# Patient Record
Sex: Male | Born: 1950 | Race: White | Hispanic: No | Marital: Married | Smoking: Never smoker
Health system: Southern US, Community
[De-identification: ages and names within clinical notes are randomized; demographics above are authoritative.]

## PROBLEM LIST (undated history)

## (undated) DIAGNOSIS — I8390 Asymptomatic varicose veins of unspecified lower extremity: Secondary | ICD-10-CM

## (undated) DIAGNOSIS — I10 Essential (primary) hypertension: Secondary | ICD-10-CM

## (undated) HISTORY — PX: TONSILLECTOMY: SUR1361

## (undated) HISTORY — PX: NASAL SEPTUM SURGERY: SHX37

## (undated) HISTORY — PX: KNEE ARTHROSCOPY: SUR90

## (undated) HISTORY — DX: Asymptomatic varicose veins of unspecified lower extremity: I83.90

---

## 2004-06-16 ENCOUNTER — Encounter: Admission: RE | Admit: 2004-06-16 | Discharge: 2004-06-16 | Payer: Self-pay | Admitting: Neurology

## 2007-09-06 ENCOUNTER — Encounter: Payer: Self-pay | Admitting: Internal Medicine

## 2007-10-04 ENCOUNTER — Ambulatory Visit: Payer: Self-pay | Admitting: Internal Medicine

## 2007-10-04 DIAGNOSIS — I10 Essential (primary) hypertension: Secondary | ICD-10-CM | POA: Insufficient documentation

## 2007-10-04 DIAGNOSIS — I8390 Asymptomatic varicose veins of unspecified lower extremity: Secondary | ICD-10-CM | POA: Insufficient documentation

## 2007-10-04 HISTORY — DX: Asymptomatic varicose veins of unspecified lower extremity: I83.90

## 2008-02-23 ENCOUNTER — Ambulatory Visit: Payer: Self-pay | Admitting: Internal Medicine

## 2008-04-02 ENCOUNTER — Ambulatory Visit: Payer: Self-pay | Admitting: Internal Medicine

## 2008-04-02 LAB — CONVERTED CEMR LAB
ALT: 24 units/L (ref 0–53)
AST: 28 units/L (ref 0–37)
Calcium: 9.1 mg/dL (ref 8.4–10.5)
Chloride: 105 meq/L (ref 96–112)
Creatinine, Ser: 1.1 mg/dL (ref 0.4–1.5)
Eosinophils Absolute: 0.5 10*3/uL (ref 0.0–0.7)
GFR calc Af Amer: 88 mL/min
Glucose, Bld: 85 mg/dL (ref 70–99)
HCT: 43.2 % (ref 39.0–52.0)
HDL: 42.2 mg/dL (ref >39.0)
Hemoglobin: 15.1 g/dL (ref 13.0–17.0)
LDL Cholesterol: 84 mg/dL (ref 0–99)
Lymphocytes Relative: 25.4 % (ref 12.0–46.0)
MCHC: 34.8 g/dL (ref 30.0–36.0)
Neutrophils Relative %: 58.3 % (ref 43.0–77.0)
Potassium: 4 meq/L (ref 3.5–5.1)
Protein, U semiquant: NEGATIVE
RBC: 5 M/uL (ref 4.22–5.81)
WBC Urine, dipstick: NEGATIVE
WBC: 7.7 10*3/uL (ref 4.5–10.5)

## 2008-04-09 ENCOUNTER — Ambulatory Visit: Payer: Self-pay | Admitting: Internal Medicine

## 2008-04-11 ENCOUNTER — Ambulatory Visit: Payer: Self-pay | Admitting: Cardiology

## 2008-04-23 ENCOUNTER — Encounter: Payer: Self-pay | Admitting: Internal Medicine

## 2009-04-04 ENCOUNTER — Ambulatory Visit: Payer: Self-pay | Admitting: Internal Medicine

## 2009-04-04 LAB — CONVERTED CEMR LAB
Basophils Absolute: 0.1 10*3/uL (ref 0.0–0.1)
Bilirubin Urine: NEGATIVE
Bilirubin, Direct: 0.2 mg/dL (ref 0.0–0.3)
Calcium: 9.5 mg/dL (ref 8.4–10.5)
Chloride: 102 meq/L (ref 96–112)
Cholesterol: 173 mg/dL (ref 0–200)
Creatinine, Ser: 1.3 mg/dL (ref 0.4–1.5)
Eosinophils Absolute: 0.6 10*3/uL (ref 0.0–0.7)
Eosinophils Relative: 7.3 % — ABNORMAL HIGH (ref 0.0–5.0)
LDL Cholesterol: 111 mg/dL — ABNORMAL HIGH (ref 0–99)
Lymphocytes Relative: 27.6 % (ref 12.0–46.0)
Lymphs Abs: 2.3 10*3/uL (ref 0.7–4.0)
Monocytes Absolute: 0.9 10*3/uL (ref 0.1–1.0)
Monocytes Relative: 10.5 % (ref 3.0–12.0)
Neutro Abs: 4.3 10*3/uL (ref 1.4–7.7)
Neutrophils Relative %: 53.7 % (ref 43.0–77.0)
Potassium: 3.9 meq/L (ref 3.5–5.1)
Sodium: 139 meq/L (ref 135–145)
Specific Gravity, Urine: 1.02 (ref 1.000–1.030)
Total CHOL/HDL Ratio: 4
Triglycerides: 93 mg/dL (ref 0.0–149.0)
Urine Glucose: NEGATIVE mg/dL
pH: 5.5 (ref 5.0–8.0)

## 2009-04-11 ENCOUNTER — Ambulatory Visit: Payer: Self-pay | Admitting: Internal Medicine

## 2009-04-23 ENCOUNTER — Ambulatory Visit: Payer: Self-pay | Admitting: Internal Medicine

## 2009-04-23 DIAGNOSIS — J069 Acute upper respiratory infection, unspecified: Secondary | ICD-10-CM | POA: Insufficient documentation

## 2009-05-31 IMAGING — CT CT PARANASAL SINUSES LIMITED
1 series · 16 of 30 positions shown, 20 images · non-contrast
Comparison: 06/16/2004 MR examination.

CLINICAL DATA: History given of chronic sinusitis.

CT PARANASAL SINUS LIMITED WITHOUT CONTRAST

[Series 2: ltd sinus 3.0 h30s · axial · 0.31mm/px · z∈[-144,-12]mm · 16 of 33 slices shown, 20 images]
[im 2/33  brain]
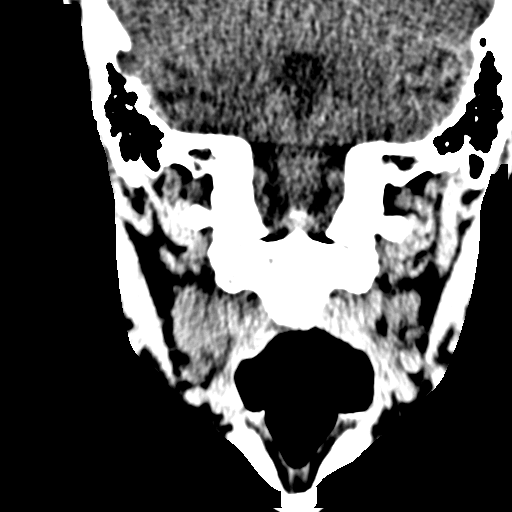
[im 2/33  bone]
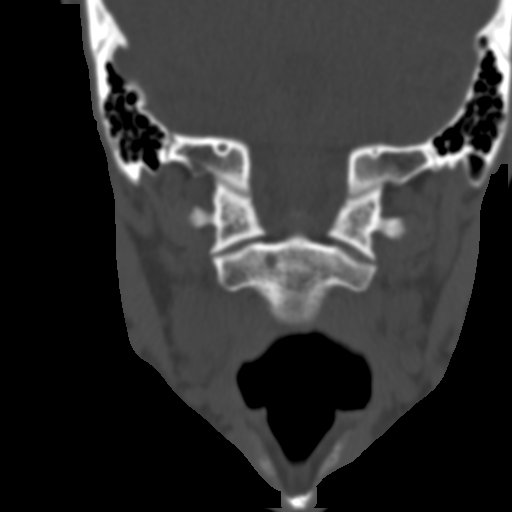
[im 4/33  bone]
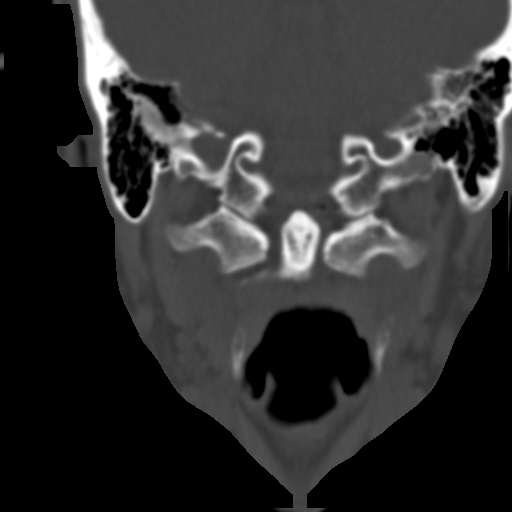
[im 6/33  bone]
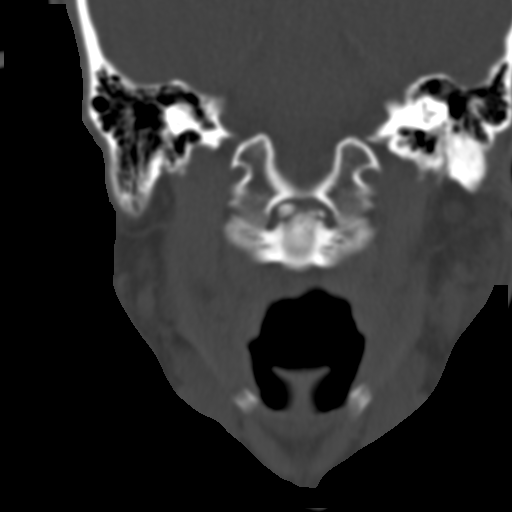
[im 8/33  bone]
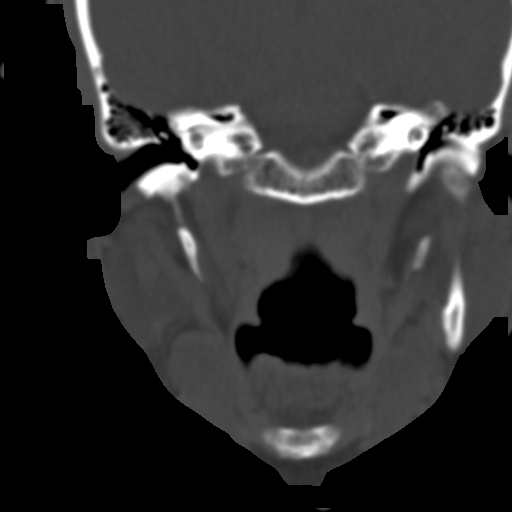
[im 9/33  brain]
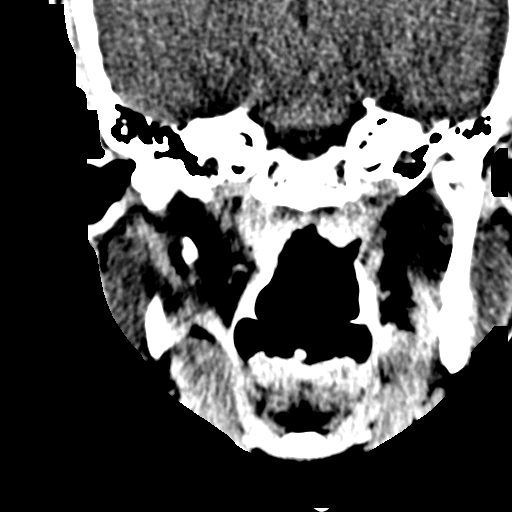
[im 9/33  bone]
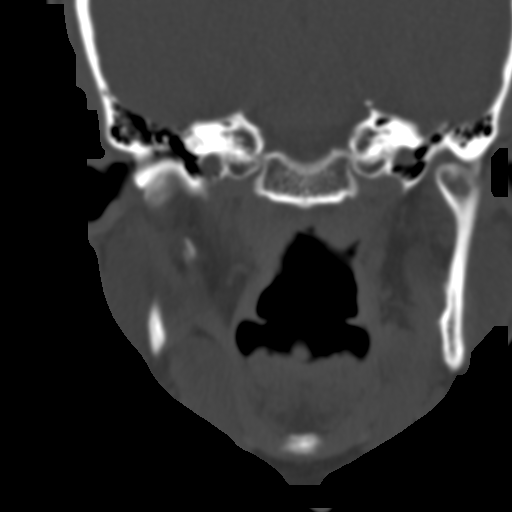
[im 12/33  bone]
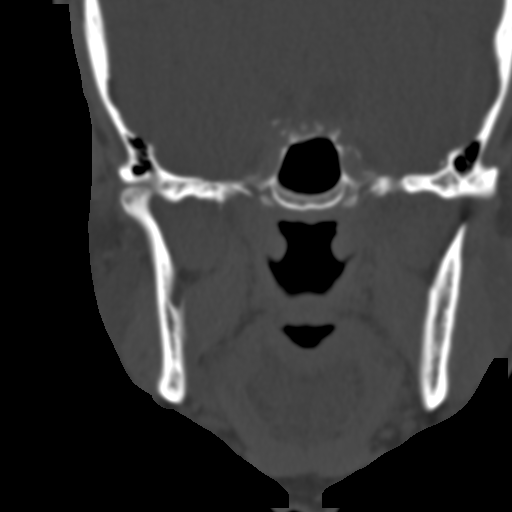
[im 14/33  bone]
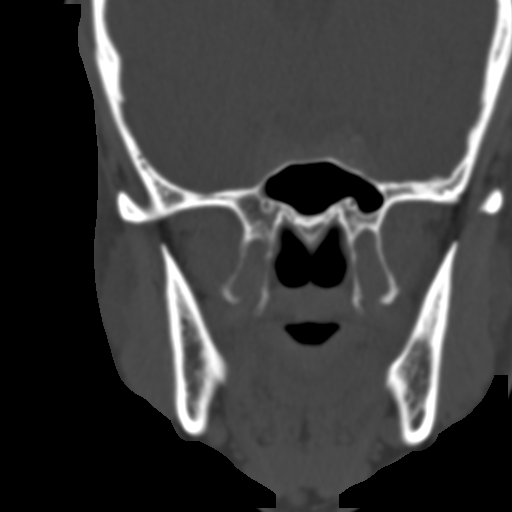
[im 16/33  bone]
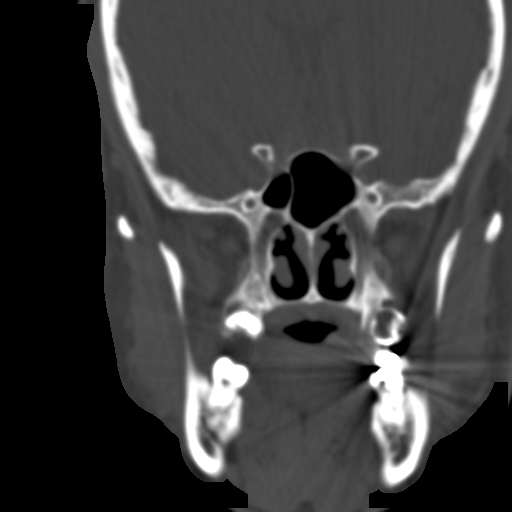
[im 17/33  brain]
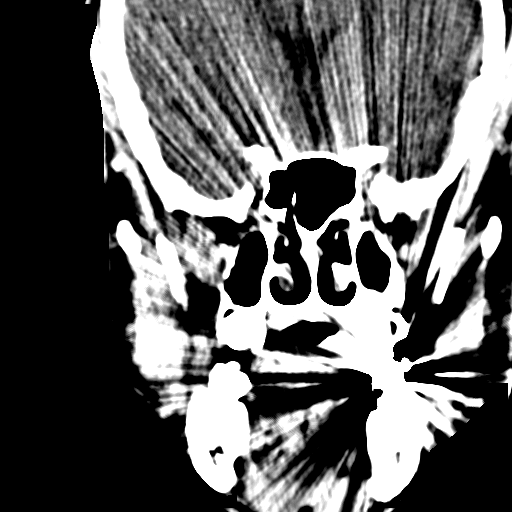
[im 17/33  bone]
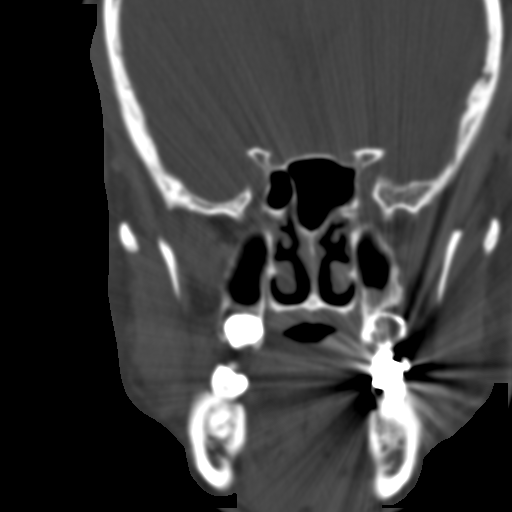
[im 19/33  bone]
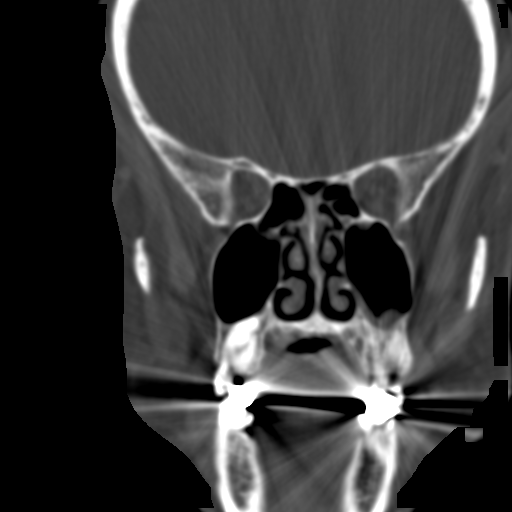
[im 21/33  bone]
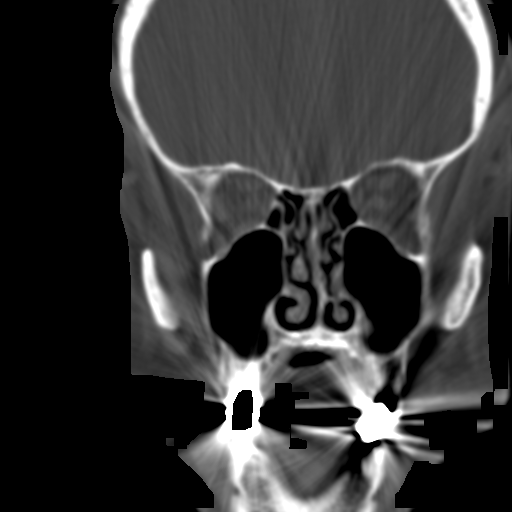
[im 24/33  bone]
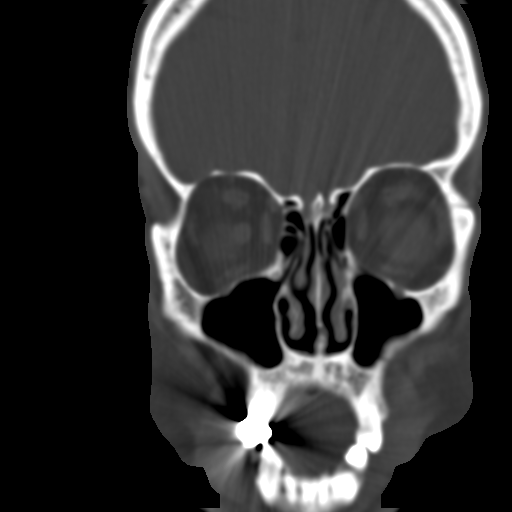
[im 25/33  brain]
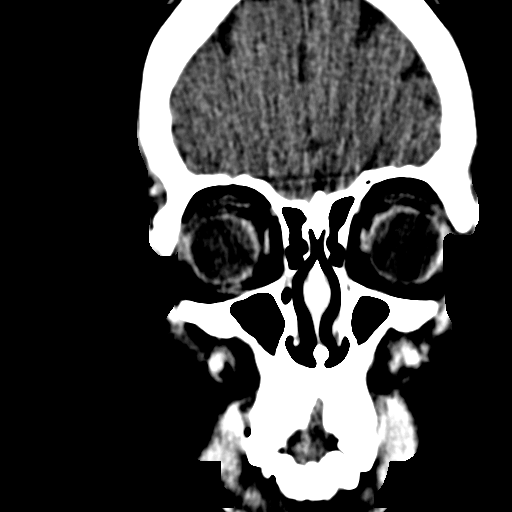
[im 25/33  bone]
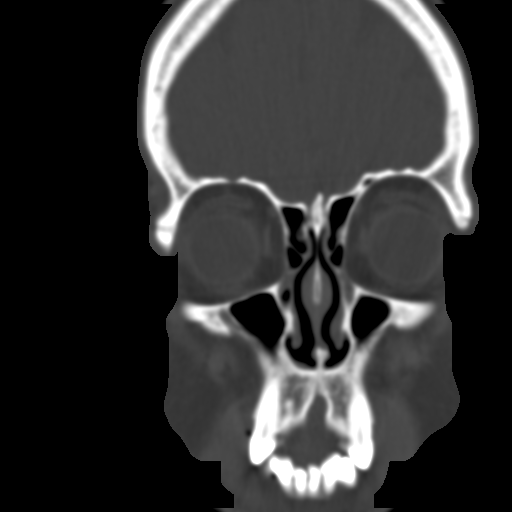
[im 27/33  bone]
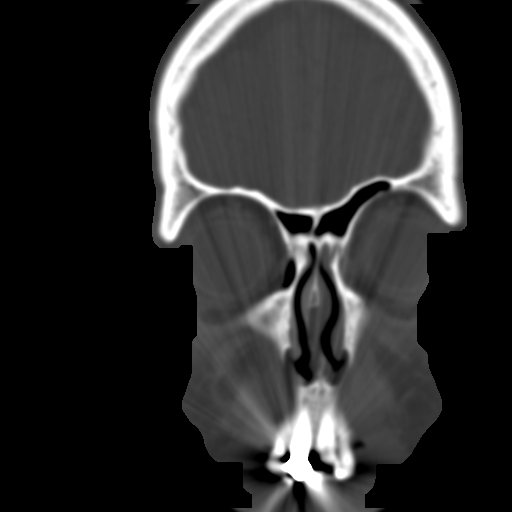
[im 29/33  bone]
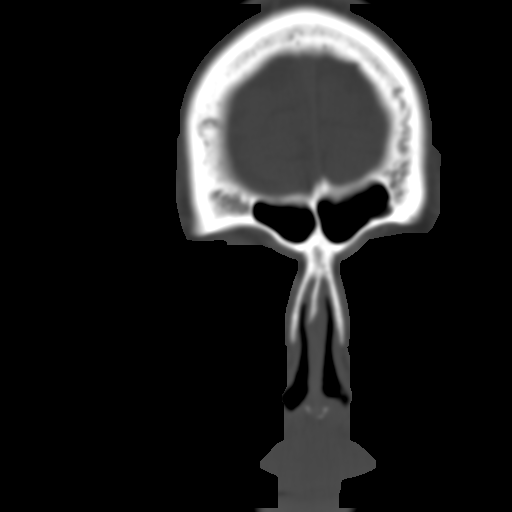
[im 31/33  bone]
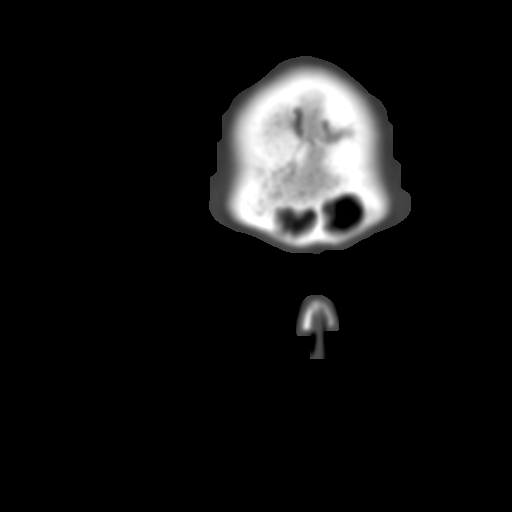

[16 of 30 positions shown; findings below may reference images not displayed]

FINDINGS: The nasal septum is intact with no evidence of
perforation or destruction.  There is slight inferior deviation to
the left with minimal spurring.  No concha bullosa is evident.  The
right maxillary ostium is not demonstrated patent.  The left
maxillary ostium is not demonstrated on this limited coronal
examination.

No mucosal thickening is evident in the right maxillary sinus.
There is mucosal thickening and mucus retention cyst or polyp
formation in the inferior aspect of the left maxillary antrum.  No
air fluid level is seen.  The ethmoid air cells are well-aerated.
No frontal sinus disease is evident.  No sphenoid sinus disease is
seen.
IMPRESSION: There is mucosal thickening and mucus retention cyst or polyp
formation in the inferior aspect of the left maxillary antrum.  No
air fluid level is seen.

## 2009-08-06 ENCOUNTER — Encounter: Payer: Self-pay | Admitting: Internal Medicine

## 2009-10-08 ENCOUNTER — Encounter: Payer: Self-pay | Admitting: Internal Medicine

## 2009-11-06 ENCOUNTER — Encounter: Payer: Self-pay | Admitting: Internal Medicine

## 2010-05-28 NOTE — Letter (Signed)
Summary:  [facility]  [facility]   Imported By: Maryln Gottron 10/23/2009 11:03:26  _____________________________________________________________________  External Attachment:    Type:   Image     Comment:   External Document

## 2010-05-28 NOTE — Letter (Signed)
Summary: [facility]   [facility]   Imported By: Maryln Gottron 11/14/2009 10:20:22  _____________________________________________________________________  External Attachment:    Type:   Image     Comment:   External Document

## 2010-05-28 NOTE — Letter (Signed)
Summary: [facility]  [facility]   Imported By: Maryln Gottron 08/14/2009 12:36:08  _____________________________________________________________________  External Attachment:    Type:   Image     Comment:   External Document

## 2010-12-18 ENCOUNTER — Encounter: Payer: Self-pay | Admitting: *Deleted

## 2010-12-18 ENCOUNTER — Emergency Department (HOSPITAL_BASED_OUTPATIENT_CLINIC_OR_DEPARTMENT_OTHER)
Admission: EM | Admit: 2010-12-18 | Discharge: 2010-12-18 | Disposition: A | Discharge status: Home / Self Care | Payer: 59 | Attending: Emergency Medicine | Admitting: Emergency Medicine

## 2010-12-18 DIAGNOSIS — T6391XA Toxic effect of contact with unspecified venomous animal, accidental (unintentional), initial encounter: Secondary | ICD-10-CM | POA: Insufficient documentation

## 2010-12-18 DIAGNOSIS — I10 Essential (primary) hypertension: Secondary | ICD-10-CM | POA: Insufficient documentation

## 2010-12-18 DIAGNOSIS — T63391A Toxic effect of venom of other spider, accidental (unintentional), initial encounter: Secondary | ICD-10-CM | POA: Insufficient documentation

## 2010-12-18 DIAGNOSIS — L02419 Cutaneous abscess of limb, unspecified: Secondary | ICD-10-CM | POA: Insufficient documentation

## 2010-12-18 HISTORY — DX: Essential (primary) hypertension: I10

## 2010-12-18 MED ORDER — CEFTRIAXONE SODIUM 1 G IJ SOLR
INTRAMUSCULAR | Status: AC
  Filled 2010-12-18: qty 1

## 2010-12-18 MED ORDER — DOXYCYCLINE HYCLATE 100 MG PO CAPS: 100.0000 mg | ORAL_CAPSULE | Freq: Two times a day (BID) | ORAL | Status: AC

## 2010-12-18 MED ORDER — CEFTRIAXONE SODIUM 1 G IJ SOLR
1.0000 g | Freq: Once | INTRAMUSCULAR | Status: AC
  Administered 2010-12-18: 1 g via INTRAMUSCULAR
  Filled 2010-12-18: qty 1

## 2010-12-18 NOTE — ED Provider Notes (Signed)
Evaluation and management procedures were performed by the mid-level provider (PA/NP/CNM) under my supervision/collaboration. I was present and available during the ED course. Gaylin Bulthuis Y.   Gavin Pound. Kenan Moodie, MD 12/18/10 1246

## 2010-12-18 NOTE — ED Provider Notes (Signed)
History     CSN: [id number] Arrival date & time: 12/18/2010 11:35 AM  Chief Complaint  Patient presents with  . Insect Bite   HPI Comments: Pt states that he was sent over here from urgent care because he may need a surgical intervention related to a compartment syndrome related to the spider bite  Patient is a 60 y.o. male presenting with abscess. The history is provided by the patient. No language interpreter was used.  Abscess  This is a new problem. The current episode started less than one week ago. The problem has been gradually worsening. The abscess is present on the right lower leg. The problem is mild. The abscess is characterized by redness. Associated with: pt states that he took some outdated doxy that he had at home. Pertinent negatives include no fever, no diarrhea and no vomiting.    Past Medical History  Diagnosis Date  . Hypertension     Past Surgical History  Procedure Date  . Knee arthroscopy   . Nasal septum surgery     History reviewed. No pertinent family history.  History  Substance Use Topics  . Smoking status: Never Smoker   . Smokeless tobacco: Never Used  . Alcohol Use: Yes     occ      Review of Systems  Constitutional: Negative for fever.  Gastrointestinal: Negative for vomiting and diarrhea.  All other systems reviewed and are negative.    Physical Exam  BP 160/101  Pulse 76  Temp(Src) 97.9 F (36.6 C) (Oral)  Resp 18  SpO2 100%  Physical Exam  Nursing note and vitals reviewed. Constitutional: He is oriented to person, place, and time. He appears well-developed and well-nourished.  HENT:  Head: Normocephalic.  Eyes: Pupils are equal, round, and reactive to light.  Cardiovascular: Normal rate and regular rhythm.   Pulmonary/Chest: Effort normal and breath sounds normal.  Musculoskeletal: Normal range of motion.  Neurological: He is alert and oriented to person, place, and time.  Skin:     Psychiatric: He has a normal mood  and affect.    ED Course  INCISION AND DRAINAGE Date/Time: 12/18/2010 12:41 PM Performed by: Teressa Lower Authorized by: Lear Ng Consent: Verbal consent obtained. Risks and benefits: risks, benefits and alternatives were discussed Consent given by: patient Patient understanding: patient states understanding of the procedure being performed Patient identity confirmed: verbally with patient Time out: Immediately prior to procedure a "time out" was called to verify the correct patient, procedure, equipment, support staff and site/side marked as required. Type: abscess Body area: lower extremity Location details: right leg Anesthesia: local infiltration Local anesthetic: lidocaine 2% without epinephrine Scalpel size: 11 Incision type: single straight Complexity: simple Drainage: purulent Drainage amount: scant Packing material: 1/4 in iodoform gauze Patient tolerance: Patient tolerated the procedure well with no immediate complications.    MDM Area I&D without any complications;will put on Doxy due to cellulitis component:pt treated with rocephin here due to the cellulitic component      Teressa Lower, NP 12/18/10 1243

## 2010-12-18 NOTE — ED Notes (Signed)
States 3 days ago woke up with a spider bite to right lower leg pt states he is convinced it started as spider bite due to fact his wife had several bites that looked the same way she saw her MD and was placed on doxycycline he took one day of doxycycline he had "left over" went to urgent care on [address] was sent here for "intervention of developing antivenom compartment syndrome from spider bite" referral states attempt made to refer to [facility] told there were no openings available therefore sent here for "intervention" area noted to be red , hot, and swollen

## 2011-12-14 ENCOUNTER — Other Ambulatory Visit (INDEPENDENT_AMBULATORY_CARE_PROVIDER_SITE_OTHER): Payer: BC Managed Care – PPO

## 2011-12-14 DIAGNOSIS — Z0000 Encounter for general adult medical examination without abnormal findings: Secondary | ICD-10-CM

## 2011-12-14 LAB — POCT URINALYSIS DIPSTICK
Bilirubin, UA: NEGATIVE
Blood, UA: NEGATIVE
Ketones, UA: NEGATIVE
Leukocytes, UA: NEGATIVE
Spec Grav, UA: 1.02
Urobilinogen, UA: 0.2

## 2011-12-14 LAB — BASIC METABOLIC PANEL
BUN: 17 mg/dL (ref 6–23)
CO2: 30 mEq/L (ref 19–32)
Calcium: 9.5 mg/dL (ref 8.4–10.5)
Creatinine, Ser: 1.3 mg/dL (ref 0.4–1.5)
Glucose, Bld: 88 mg/dL (ref 70–99)
Potassium: 4.4 mEq/L (ref 3.5–5.1)

## 2011-12-14 LAB — HEPATIC FUNCTION PANEL
ALT: 19 U/L (ref 0–53)
Albumin: 4.3 g/dL (ref 3.5–5.2)
Alkaline Phosphatase: 59 U/L (ref 39–117)
Total Bilirubin: 1 mg/dL (ref 0.3–1.2)
Total Protein: 7 g/dL (ref 6.0–8.3)

## 2011-12-14 LAB — CBC WITH DIFFERENTIAL/PLATELET
Basophils Absolute: 0.1 10*3/uL (ref 0.0–0.1)
Eosinophils Relative: 7.6 % — ABNORMAL HIGH (ref 0.0–5.0)
HCT: 47.3 % (ref 39.0–52.0)
Hemoglobin: 15.8 g/dL (ref 13.0–17.0)
Monocytes Absolute: 0.8 10*3/uL (ref 0.1–1.0)
Neutro Abs: 4.4 10*3/uL (ref 1.4–7.7)
Platelets: 254 10*3/uL (ref 150.0–400.0)
RDW: 13.3 % (ref 11.5–14.6)

## 2011-12-14 LAB — TSH: TSH: 2.38 u[IU]/mL (ref 0.35–5.50)

## 2011-12-14 LAB — LIPID PANEL: Cholesterol: 174 mg/dL (ref 0–200)

## 2011-12-21 ENCOUNTER — Encounter: Payer: Self-pay | Admitting: Internal Medicine

## 2011-12-21 ENCOUNTER — Ambulatory Visit (INDEPENDENT_AMBULATORY_CARE_PROVIDER_SITE_OTHER): Payer: BC Managed Care – PPO | Admitting: Internal Medicine

## 2011-12-21 VITALS — BP 140/95 | HR 60 | Temp 97.8°F | Ht 68.5 in | Wt 177.0 lb

## 2011-12-21 DIAGNOSIS — Z0000 Encounter for general adult medical examination without abnormal findings: Secondary | ICD-10-CM

## 2011-12-21 DIAGNOSIS — Z2911 Encounter for prophylactic immunotherapy for respiratory syncytial virus (RSV): Secondary | ICD-10-CM

## 2011-12-21 MED ORDER — TRIAMTERENE-HCTZ 37.5-25 MG PO TABS: 1.0000 | ORAL_TABLET | Freq: Every day | ORAL | Status: DC

## 2011-12-21 MED ORDER — ALBUTEROL SULFATE HFA 108 (90 BASE) MCG/ACT IN AERS: 2.0000 | INHALATION_SPRAY | Freq: Four times a day (QID) | RESPIRATORY_TRACT | Status: DC | PRN

## 2011-12-21 NOTE — Progress Notes (Signed)
Patient ID: Gregory Taylor, male   DOB: 11-28-1950, 61 y.o.   MRN: [id number] cpx  Has noted nocturnal cough- resolves with lozenge Has a significant amount of GERD.  Past Medical History  Diagnosis Date  . Hypertension     History   Social History  . Marital Status: Married    Spouse Name: N/A    Number of Children: N/A  . Years of Education: N/A   Occupational History  . Not on file.   Social History Main Topics  . Smoking status: Never Smoker   . Smokeless tobacco: Never Used  . Alcohol Use: Yes     occ  . Drug Use: No  . Sexually Active: Yes   Other Topics Concern  . Not on file   Social History Narrative  . No narrative on file    Past Surgical History  Procedure Date  . Knee arthroscopy   . Nasal septum surgery     No family history on file.  No Known Allergies  No current outpatient prescriptions on file prior to visit.     patient denies chest pain, shortness of breath, orthopnea. Denies lower extremity edema, abdominal pain, change in appetite, change in bowel movements. Patient denies rashes, musculoskeletal complaints. No other specific complaints in a complete review of systems.   BP 144/104  Pulse 60  Temp 97.8 F (36.6 C) (Oral)  Ht 5' 8.5" (1.74 m)  Wt 177 lb (80.287 kg)  BMI 26.52 kg/m2  well-developed well-nourished male in no acute distress. HEENT exam atraumatic, normocephalic, neck supple without jugular venous distention. Chest clear to auscultation cardiac exam S1-S2 are regular. Abdominal exam overweight with bowel sounds, soft and nontender. Extremities no edema. Neurologic exam is alert with a normal gait.  A/p- well visit- health maint UTD

## 2012-01-18 ENCOUNTER — Encounter: Payer: Self-pay | Admitting: *Deleted

## 2012-01-18 ENCOUNTER — Encounter: Payer: Self-pay | Admitting: Cardiovascular Disease

## 2012-01-19 ENCOUNTER — Ambulatory Visit: Payer: BC Managed Care – PPO | Admitting: Cardiovascular Disease

## 2012-12-31 ENCOUNTER — Other Ambulatory Visit: Payer: Self-pay | Admitting: Internal Medicine

## 2013-06-21 ENCOUNTER — Ambulatory Visit: Payer: BC Managed Care – PPO | Admitting: Cardiology

## 2013-08-09 ENCOUNTER — Ambulatory Visit (INDEPENDENT_AMBULATORY_CARE_PROVIDER_SITE_OTHER): Payer: 59 | Admitting: Cardiology

## 2013-08-09 ENCOUNTER — Encounter: Payer: Self-pay | Admitting: *Deleted

## 2013-08-09 ENCOUNTER — Encounter: Payer: Self-pay | Admitting: Cardiology

## 2013-08-09 VITALS — BP 137/84 | HR 63 | Ht 68.5 in | Wt 170.0 lb

## 2013-08-09 DIAGNOSIS — R0600 Dyspnea, unspecified: Secondary | ICD-10-CM | POA: Insufficient documentation

## 2013-08-09 DIAGNOSIS — I10 Essential (primary) hypertension: Secondary | ICD-10-CM

## 2013-08-09 DIAGNOSIS — R0609 Other forms of dyspnea: Secondary | ICD-10-CM

## 2013-08-09 DIAGNOSIS — R0989 Other specified symptoms and signs involving the circulatory and respiratory systems: Secondary | ICD-10-CM

## 2013-08-09 LAB — CBC WITH DIFFERENTIAL/PLATELET
BASOS PCT: 0.9 % (ref 0.0–3.0)
Basophils Absolute: 0.1 10*3/uL (ref 0.0–0.1)
EOS ABS: 1.4 10*3/uL — AB (ref 0.0–0.7)
Eosinophils Relative: 14.8 % — ABNORMAL HIGH (ref 0.0–5.0)
HCT: 43.8 % (ref 39.0–52.0)
Hemoglobin: 14.9 g/dL (ref 13.0–17.0)
LYMPHS ABS: 2.1 10*3/uL (ref 0.7–4.0)
LYMPHS PCT: 22.9 % (ref 12.0–46.0)
MCHC: 34.1 g/dL (ref 30.0–36.0)
MCV: 86.6 fl (ref 78.0–100.0)
MONOS PCT: 9 % (ref 3.0–12.0)
Monocytes Absolute: 0.8 10*3/uL (ref 0.1–1.0)
NEUTROS ABS: 4.9 10*3/uL (ref 1.4–7.7)
NEUTROS PCT: 52.4 % (ref 43.0–77.0)
PLATELETS: 271 10*3/uL (ref 150.0–400.0)
RBC: 5.06 Mil/uL (ref 4.22–5.81)
RDW: 13.5 % (ref 11.5–14.6)
WBC: 9.3 10*3/uL (ref 4.5–10.5)

## 2013-08-09 LAB — BASIC METABOLIC PANEL
BUN: 19 mg/dL (ref 6–23)
CALCIUM: 9.3 mg/dL (ref 8.4–10.5)
CO2: 29 mEq/L (ref 19–32)
CREATININE: 1.3 mg/dL (ref 0.4–1.5)
Chloride: 102 mEq/L (ref 96–112)
GFR: 61.72 mL/min (ref >60.00)
GLUCOSE: 77 mg/dL (ref 70–99)
POTASSIUM: 3.7 meq/L (ref 3.5–5.1)
Sodium: 139 mEq/L (ref 135–145)

## 2013-08-09 LAB — HEPATIC FUNCTION PANEL
ALK PHOS: 54 U/L (ref 39–117)
ALT: 16 U/L (ref 0–53)
AST: 21 U/L (ref 0–37)
Albumin: 3.9 g/dL (ref 3.5–5.2)
BILIRUBIN DIRECT: 0 mg/dL (ref 0.0–0.3)
Total Bilirubin: 0.7 mg/dL (ref 0.3–1.2)
Total Protein: 6.5 g/dL (ref 6.0–8.3)

## 2013-08-09 LAB — LIPID PANEL
CHOL/HDL RATIO: 4
CHOLESTEROL: 166 mg/dL (ref 0–200)
HDL: 45.3 mg/dL (ref >39.00)
LDL CALC: 73 mg/dL (ref 0–99)
Triglycerides: 239 mg/dL — ABNORMAL HIGH (ref 0.0–149.0)
VLDL: 47.8 mg/dL — ABNORMAL HIGH (ref 0.0–40.0)

## 2013-08-09 LAB — TSH: TSH: 1.32 u[IU]/mL (ref 0.35–5.50)

## 2013-08-09 NOTE — Assessment & Plan Note (Signed)
Patient's blood pressure appears to be controlled. Continue present medications. Check potassium, renal function. Will also check baseline screening laboratories including CBC, TSH and lipids. Further adjustment in antihypertensive regimen based on followup readings.

## 2013-08-09 NOTE — Progress Notes (Signed)
     HPI: 63 yo male for evaluation of hypertension. No prior cardiac history. Patient exercises routinely with no dyspnea on exertion, orthopnea or PND, pedal edema, palpitations, syncope or chest pain. In January he noticed that he had transient increase in blood pressure with occasional readings of 180/110. He also noticed dyspnea with more vigorous activities which is uncommon. These symptoms have since completely resolved and his blood pressure is now in the 138/85 range. He occasionally has indigestion at night when lying on his left side he does not have any exertional chest pain. He is able to ride his bicycle 2 hours with no symptoms.  Current Outpatient Prescriptions  Medication Sig Dispense Refill  . triamterene-hydrochlorothiazide (MAXZIDE-25) 37.5-25 MG per tablet TAKE ONE TABLET BY MOUTH DAILY  90 tablet  0   No current facility-administered medications for this visit.    No Known Allergies  Past Medical History  Diagnosis Date  . Hypertension   . VARICOSE VEINS, LOWER EXTREMITIES 10/04/2007    Qualifier: Diagnosis of  By: Cato MulliganSwords MD, Bruce      Past Surgical History  Procedure Laterality Date  . Knee arthroscopy    . Nasal septum surgery    . Tonsillectomy      History   Social History  . Marital Status: Married    Spouse Name: N/A    Number of Children: 2  . Years of Education: N/A   Occupational History  .      Coporate trainer   Social History Main Topics  . Smoking status: Never Smoker   . Smokeless tobacco: Never Used  . Alcohol Use: Yes     Comment: occ  . Drug Use: No  . Sexual Activity: Yes   Other Topics Concern  . Not on file   Social History Narrative  . No narrative on file    Family History  Problem Relation Age of Onset  . Hypertension    . Stroke Mother   . Cancer Father   . Heart attack Father     x3; first MI age 65    ROS: no fevers or chills, productive cough, hemoptysis, dysphasia, odynophagia, melena, hematochezia,  dysuria, hematuria, rash, seizure activity, orthopnea, PND, pedal edema, claudication. Remaining systems are negative.  Physical Exam:   Blood pressure 137/84, pulse 63, height 5' 8.5" (1.74 m), weight 170 lb (77.111 kg).  General:  Well developed/well nourished in NAD Skin warm/dry Patient not depressed No peripheral clubbing Back-normal HEENT-normal/normal eyelids Neck supple/normal carotid upstroke bilaterally; no bruits; no JVD; no thyromegaly chest - CTA/ normal expansion CV - RRR/normal S1 and S2; no murmurs, rubs or gallops;  PMI nondisplaced Abdomen -NT/ND, no HSM, no mass, + bowel sounds, no bruit 2+ femoral pulses, no bruits Ext-no edema, chords, 2+ DP Neuro-grossly nonfocal  ECG Sinus rhythm with first degree AV block. No ST changes.

## 2013-08-09 NOTE — Patient Instructions (Signed)
Your physician wants you to follow-up in: ONE YEAR WITH DR CRENSHAW You will receive a reminder letter in the mail two months in advance. If you don't receive a letter, please call our office to schedule the follow-up appointment.   Your physician recommends that you HAVE LAB WORK TODAY 

## 2013-08-09 NOTE — Assessment & Plan Note (Signed)
Patient had transient dyspnea on exertion in January but this has completely resolved. We discussed an exercise treadmill but he would prefer observation at this point. If he develops any worsening symptoms we will plan further evaluation at that time.

## 2013-08-10 ENCOUNTER — Other Ambulatory Visit: Payer: Self-pay | Admitting: *Deleted

## 2013-08-10 MED ORDER — TRIAMTERENE-HCTZ 37.5-25 MG PO TABS: ORAL_TABLET | ORAL | Status: DC

## 2014-02-23 ENCOUNTER — Other Ambulatory Visit: Payer: Self-pay

## 2014-02-23 MED ORDER — TRIAMTERENE-HCTZ 37.5-25 MG PO TABS: ORAL_TABLET | ORAL | Status: DC

## 2016-11-24 NOTE — Telephone Encounter (Signed)
Kary KosBecky - [facility] [phone]    Pt is a new admit, rm. 117. Needs to verify orders.

## 2016-11-24 NOTE — Telephone Encounter (Signed)
Paged in error, to be admitted to another group

## 2019-05-12 ENCOUNTER — Other Ambulatory Visit: Payer: Self-pay

## 2019-05-12 DIAGNOSIS — Z20822 Contact with and (suspected) exposure to covid-19: Secondary | ICD-10-CM

## 2019-05-13 LAB — NOVEL CORONAVIRUS, NAA: SARS-CoV-2, NAA: NOT DETECTED

## 2019-05-26 ENCOUNTER — Ambulatory Visit: Payer: Self-pay

## 2019-05-31 ENCOUNTER — Ambulatory Visit: Payer: Self-pay

## 2019-06-03 ENCOUNTER — Ambulatory Visit: Payer: Medicare Other | Attending: Internal Medicine

## 2020-11-06 NOTE — Progress Notes (Signed)
Office Visit Note  Patient: Gregory Taylor             Date of Birth: 10-25-1950           MRN: [id number]             PCP: Kristen Loader, FNP Referring: Kristen Loader, FNP Visit Date: 11/20/2020 Occupation: @GUAROCC @  Subjective:  Pain in multiple joints   History of Present Illness: Gregory Taylor is a 70 y.o. male seen in consultation per request of his PCP.  According the patient his symptoms a started 18 months ago with pain and discomfort in his right shoulder and neck.  He states he was seen by his PCP and the x-rays were consistent with degenerative disc disease of the cervical spine.  He states he was given an intramuscular steroid injection and the symptoms resolved after a few days.  8 months later he developed left groin pain and it was severe to the point he was having difficulty walking.  He was seen at [facility] office where he had x-rays and was told that he had a muscle strain.  They also told him that he had degenerative changes in his lumbar spine.  He was given intramuscular steroid injection, muscle relaxers and a prednisone taper and the symptoms improved.  He states in May 2022 he started having difficulty rolling in the bed and severe pain and discomfort in his neck he states his neck was locked and he could not move it in any direction.  The pain from the neck was radiating down into his back.  He states he tried over-the-counter medications and ice without much help.  He was seen by his PCP who did x-rays and told him he had degenerative changes.  He was given a steroid shot prednisone taper and muscle relaxers and the symptoms gradually improved.  At the same time he also developed swelling in his right ankle.  He states his ankle was very swollen and he had difficulty walking.  But after the steroid injection the ankle improved.  He recalls that his PCP obtain uric acid levels which were normal.  He has had off-and-on discomfort in his right knee joint.  He denies  any history of knee joint swelling.  He has some stiffness in his hands but no history of joint swelling.  There is family history of osteoarthritis in his mother.  There is no family history of autoimmune disease.  He is very active.  He plays tennis 4 times a week and also participates in the tournaments.  He practices yoga daily and also rides mountain bike.  Activities of Daily Living:  Patient reports morning stiffness for 1 hour.   Patient Denies nocturnal pain.  Difficulty dressing/grooming: Denies Difficulty climbing stairs: Denies Difficulty getting out of chair: Denies Difficulty using hands for taps, buttons, cutlery, and/or writing: Denies  Review of Systems  Constitutional:  Negative for fatigue.  HENT:  Negative for mouth sores, mouth dryness and nose dryness.   Eyes:  Negative for pain, itching and dryness.  Respiratory:  Negative for shortness of breath and difficulty breathing.   Cardiovascular:  Negative for chest pain and palpitations.  Gastrointestinal:  Negative for blood in stool, constipation and diarrhea.  Endocrine: Negative for increased urination.  Genitourinary:  Negative for difficulty urinating.  Musculoskeletal:  Positive for myalgias, morning stiffness and myalgias. Negative for joint pain, joint pain, joint swelling and muscle tenderness.  Skin:  Negative for color  change, rash and redness.  Allergic/Immunologic: Negative for susceptible to infections.  Neurological:  Negative for dizziness, numbness, headaches, memory loss and weakness.  Hematological:  Negative for bruising/bleeding tendency.  Psychiatric/Behavioral:  Negative for confusion.    PMFS History:  Patient Active Problem List   Diagnosis Date Noted   Dyspnea 08/09/2013   HYPERTENSION 10/04/2007   VARICOSE VEINS, LOWER EXTREMITIES 10/04/2007    Past Medical History:  Diagnosis Date   Hypertension    VARICOSE VEINS, LOWER EXTREMITIES 10/04/2007   Qualifier: Diagnosis of  By: Leanne Chang MD,  Bruce      Family History  Problem Relation Age of Onset   Stroke Mother    Cancer Father    Heart attack Father        x3; first MI age 67   Hypertension Other    Healthy Daughter    Healthy Daughter    Past Surgical History:  Procedure Laterality Date   KNEE ARTHROSCOPY Right    NASAL SEPTUM SURGERY     TONSILLECTOMY     Social History   Social History Narrative   Not on file   Immunization History  Administered Date(s) Administered   Influenza Whole 02/27/2009   Influenza-Unspecified 01/17/2014   PFIZER(Purple Top)SARS-COV-2 Vaccination 05/24/2019, 06/21/2019, 01/25/2020   Td 04/11/2009   Zoster, Live 12/21/2011     Objective: Vital Signs: BP 131/84 (BP Location: Right Arm, Patient Position: Sitting, Cuff Size: Normal)   Pulse (!) 58   Ht 5' 7.5" (1.715 m)   Wt 149 lb 6.4 oz (67.8 kg)   BMI 23.05 kg/m    Physical Exam Vitals and nursing note reviewed.  Constitutional:      Appearance: He is well-developed.  HENT:     Head: Normocephalic and atraumatic.  Eyes:     Conjunctiva/sclera: Conjunctivae normal.     Pupils: Pupils are equal, round, and reactive to light.  Cardiovascular:     Rate and Rhythm: Normal rate and regular rhythm.     Heart sounds: Normal heart sounds.  Pulmonary:     Effort: Pulmonary effort is normal.     Breath sounds: Normal breath sounds.  Abdominal:     General: Bowel sounds are normal.     Palpations: Abdomen is soft.  Musculoskeletal:     Cervical back: Normal range of motion and neck supple.  Skin:    General: Skin is warm and dry.     Capillary Refill: Capillary refill takes less than 2 seconds.  Neurological:     Mental Status: He is alert and oriented to person, place, and time.  Psychiatric:        Behavior: Behavior normal.     Musculoskeletal Exam: He has some limitation with right lateral rotation of the cervical spine.  Thoracic and lumbar spine were in good range of motion.  Describes discomfort in the lower  lumbar region.  Shoulder joints, elbow joints, wrist joints, MCPs PIPs and DIPs with good range of motion.  He had bilateral PIP and DIP thickening more prominent in the right hand than the left.  He had diffusions contracture in the right fourth flexor tendon.  Hip joints, knee joints, ankles, MTPs with good range of motion.  He had bilateral first MTP thickening.  CDAI Exam: CDAI Score: -- Patient Global: --; Provider Global: -- Swollen: --; Tender: -- Joint Exam 11/20/2020   No joint exam has been documented for this visit   There is currently no information documented on the homunculus. Go to  the Rheumatology activity and complete the homunculus joint exam.  Investigation: No additional findings.  Imaging: No results found.  Recent Labs: Lab Results  Component Value Date   WBC 9.3 08/09/2013   HGB 14.9 08/09/2013   PLT 271.0 08/09/2013   NA 139 08/09/2013   K 3.7 08/09/2013   CL 102 08/09/2013   CO2 29 08/09/2013   GLUCOSE 77 08/09/2013   BUN 19 08/09/2013   CREATININE 1.3 08/09/2013   BILITOT 0.7 08/09/2013   ALKPHOS 54 08/09/2013   AST 21 08/09/2013   ALT 16 08/09/2013   PROT 6.5 08/09/2013   ALBUMIN 3.9 08/09/2013   CALCIUM 9.3 08/09/2013   GFRAA 88 04/02/2008    Speciality Comments: No specialty comments available.  Procedures:  No procedures performed Allergies: Patient has no known allergies.   Assessment / Plan:     Visit Diagnoses: Neck pain -he gives history of episodic neck pain.  He had limited right lateral rotation of the cervical spine.  He placed tennis and also participates in the tournaments.  He states sometimes neck pain is starts after playing tennis.  Plan: XR Cervical Spine 2 or 3 views.  Multilevel spondylosis and facet joint arthropathy was noted.  I gave a handout on cervical spine exercises.  I offered her referral to physical therapy.  Patient is going to his [facility] and will be back in 6 months.  He will discuss that at the  follow-up visit.  Chronic midline low back pain without sciatica -he has had lower back pain off and on for a while.  He had some episode of severe lower back pain in the past to the point he had difficulty walking.  Plan: XR Lumbar Spine 2-3 Views.  X-ray of the lumbar spine showed dextroscoliosis, multilevel spondylosis and facet joint arthropathy.  Core strengthening exercises were demonstrated in the office.  A handout on lumbar spine exercises was given.  He declined physical therapy at this time.  Primary osteoarthritis of both hands-DIP and PIP thickening was noted consistent with osteoarthritis.  No synovitis was noted.  Chronic pain of right knee-he gives history of right knee joint pain off and on.  He has never had knee joint swelling.  He related to playing tennis.  Right ankle swelling-he had an episode of right ankle joint swelling in May 2022.  At the same time he also had neck pain.  He states his symptoms resolved after prednisone taper and steroid shot.  He had no recurrence of ankle swelling.  He denies any history of ankle trauma at the time.  He states that the uric acid was normal.  I advised him to contact me if he develops episode of swelling again.  Elevated C-reactive protein (CRP) - 09/20/20:CRP 42, ANA negative, ESR 14, RF<10  Essential hypertension-is a pressure is controlled on triamterene hydrochlorothiazide.  Mild acid reflux-he gives history of mild reflux symptoms does not require any treatment for that.  Asymptomatic varicose veins of both lower extremities  History of BPH  Orders: Orders Placed This Encounter  Procedures   XR Cervical Spine 2 or 3 views   XR Lumbar Spine 2-3 Views    No orders of the defined types were placed in this encounter.    Follow-Up Instructions: Return for pain in joints.   Bo Merino, MD  Note - This record has been created using Editor, commissioning.  Chart creation errors have been sought, but may not always  have  been located. Such creation  errors do not reflect on  the standard of medical care.

## 2020-11-20 ENCOUNTER — Ambulatory Visit: Payer: Self-pay

## 2020-11-20 ENCOUNTER — Encounter: Payer: Self-pay | Admitting: Rheumatology

## 2020-11-20 ENCOUNTER — Ambulatory Visit: Payer: Medicare Other | Admitting: Rheumatology

## 2020-11-20 ENCOUNTER — Other Ambulatory Visit: Payer: Self-pay

## 2020-11-20 VITALS — BP 131/84 | HR 58 | Ht 67.5 in | Wt 149.4 lb

## 2020-11-20 DIAGNOSIS — M542 Cervicalgia: Secondary | ICD-10-CM | POA: Diagnosis not present

## 2020-11-20 DIAGNOSIS — K219 Gastro-esophageal reflux disease without esophagitis: Secondary | ICD-10-CM

## 2020-11-20 DIAGNOSIS — M545 Low back pain, unspecified: Secondary | ICD-10-CM | POA: Diagnosis not present

## 2020-11-20 DIAGNOSIS — G8929 Other chronic pain: Secondary | ICD-10-CM

## 2020-11-20 DIAGNOSIS — M25471 Effusion, right ankle: Secondary | ICD-10-CM

## 2020-11-20 DIAGNOSIS — M19041 Primary osteoarthritis, right hand: Secondary | ICD-10-CM

## 2020-11-20 DIAGNOSIS — M25561 Pain in right knee: Secondary | ICD-10-CM

## 2020-11-20 DIAGNOSIS — R7982 Elevated C-reactive protein (CRP): Secondary | ICD-10-CM

## 2020-11-20 DIAGNOSIS — I10 Essential (primary) hypertension: Secondary | ICD-10-CM

## 2020-11-20 DIAGNOSIS — M19042 Primary osteoarthritis, left hand: Secondary | ICD-10-CM

## 2020-11-20 DIAGNOSIS — M2550 Pain in unspecified joint: Secondary | ICD-10-CM

## 2020-11-20 DIAGNOSIS — Z87438 Personal history of other diseases of male genital organs: Secondary | ICD-10-CM

## 2020-11-20 DIAGNOSIS — I8393 Asymptomatic varicose veins of bilateral lower extremities: Secondary | ICD-10-CM

## 2020-11-20 NOTE — Patient Instructions (Signed)
Cervical Strain and Sprain Rehab Ask your health care provider which exercises are safe for you. Do exercises exactly as told by your health care provider and adjust them as directed. It is normal to feel mild stretching, pulling, tightness, or discomfort as you do these exercises. Stop right away if you feel sudden pain or your pain gets worse. Do not begin these exercises until told by your health care provider. Stretching and range-of-motion exercises Cervical side bending  Using good posture, sit on a stable chair or stand up. Without moving your shoulders, slowly tilt your left / right ear to your shoulder until you feel a stretch in the opposite side neck muscles. You should be looking straight ahead. Hold for __________ seconds. Repeat with the other side of your neck. Repeat __________ times. Complete this exercise __________ times a day. Cervical rotation  Using good posture, sit on a stable chair or stand up. Slowly turn your head to the side as if you are looking over your left / right shoulder. Keep your eyes level with the ground. Stop when you feel a stretch along the side and the back of your neck. Hold for __________ seconds. Repeat this by turning to your other side. Repeat __________ times. Complete this exercise __________ times a day. Thoracic extension and pectoral stretch Roll a towel or a small blanket so it is about 4 inches (10 cm) in diameter. Lie down on your back on a firm surface. Put the towel lengthwise, under your spine in the middle of your back. It should not be under your shoulder blades. The towel should line up with your spine from your middle back to your lower back. Put your hands behind your head and let your elbows fall out to your sides. Hold for __________ seconds. Repeat __________ times. Complete this exercise __________ times a day. Strengthening exercises Isometric upper cervical flexion Lie on your back with a thin pillow behind your head  and a small rolled-up towel under your neck. Gently tuck your chin toward your chest and nod your head down to look toward your feet. Do not lift your head off the pillow. Hold for __________ seconds. Release the tension slowly. Relax your neck muscles completely before you repeat this exercise. Repeat __________ times. Complete this exercise __________ times a day. Isometric cervical extension  Stand about 6 inches (15 cm) away from a wall, with your back facing the wall. Place a soft object, about 6-8 inches (15-20 cm) in diameter, between the back of your head and the wall. A soft object could be a small pillow, a ball, or a folded towel. Gently tilt your head back and press into the soft object. Keep your jaw and forehead relaxed. Hold for __________ seconds. Release the tension slowly. Relax your neck muscles completely before you repeat this exercise. Repeat __________ times. Complete this exercise __________ times a day. Posture and body mechanics Body mechanics refers to the movements and positions of your body while you do your daily activities. Posture is part of body mechanics. Good posture and healthy body mechanics can help to relieve stress in your body's tissues and joints. Good posture means that your spine is in its natural S-curve position (your spine is neutral), your shoulders are pulled back slightly, and your head is not tipped forward. The following are general guidelines for applying improved posture andbody mechanics to your everyday activities. Sitting  When sitting, keep your spine neutral and keep your feet flat on the floor. Use   a footrest, if necessary, and keep your thighs parallel to the floor. Avoid rounding your shoulders, and avoid tilting your head forward. When working at a desk or a computer, keep your desk at a height where your hands are slightly lower than your elbows. Slide your chair under your desk so you are close enough to maintain good posture. When  working at a computer, place your monitor at a height where you are looking straight ahead and you do not have to tilt your head forward or downward to look at the screen.  Standing  When standing, keep your spine neutral and keep your feet about hip-width apart. Keep a slight bend in your knees. Your ears, shoulders, and hips should line up. When you do a task in which you stand in one place for a long time, place one foot up on a stable object that is 2-4 inches (5-10 cm) high, such as a footstool. This helps keep your spine neutral.  Resting When lying down and resting, avoid positions that are most painful for you. Try to support your neck in a neutral position. You can use a contour pillow or asmall rolled-up towel. Your pillow should support your neck but not push on it. This information is not intended to replace advice given to you by your health care provider. Make sure you discuss any questions you have with your healthcare provider. Document Revised: 08/02/2018 Document Reviewed: 01/11/2018 [employer] Patient Education  2022 [employer] Back Exercises The following exercises strengthen the muscles that help to support the trunk and back. They also help to keep the lower back flexible. Doing these exercises can help to prevent back pain or lessen existing pain. If you have back pain or discomfort, try doing these exercises 2-3 times each day or as told by your health care provider. As your pain improves, do them once each day, but increase the number of times that you repeat the steps for each exercise (do more repetitions). To prevent the recurrence of back pain, continue to do these exercises once each day or as told by your health care provider. Do exercises exactly as told by your health care provider and adjust them as directed. It is normal to feel mild stretching, pulling, tightness, or discomfort as you do these exercises, but you should stop right away if youfeel sudden pain or  your pain gets worse. Exercises Single knee to chest Repeat these steps 3-5 times for each leg: Lie on your back on a firm bed or the floor with your legs extended. Bring one knee to your chest. Your other leg should stay extended and in contact with the floor. Hold your knee in place by grabbing your knee or thigh with both hands and hold. Pull on your knee until you feel a gentle stretch in your lower back or buttocks. Hold the stretch for 10-30 seconds. Slowly release and straighten your leg. Pelvic tilt Repeat these steps 5-10 times: Lie on your back on a firm bed or the floor with your legs extended. Bend your knees so they are pointing toward the ceiling and your feet are flat on the floor. Tighten your lower abdominal muscles to press your lower back against the floor. This motion will tilt your pelvis so your tailbone points up toward the ceiling instead of pointing to your feet or the floor. With gentle tension and even breathing, hold this position for 5-10 seconds. Cat-cow Repeat these steps until your lower back becomes more flexible: Get   into a hands-and-knees position on a firm surface. Keep your hands under your shoulders, and keep your knees under your hips. You may place padding under your knees for comfort. Let your head hang down toward your chest. Contract your abdominal muscles and point your tailbone toward the floor so your lower back becomes rounded like the back of a cat. Hold this position for 5 seconds. Slowly lift your head, let your abdominal muscles relax and point your tailbone up toward the ceiling so your back forms a sagging arch like the back of a cow. Hold this position for 5 seconds.  Press-ups Repeat these steps 5-10 times: Lie on your abdomen (face-down) on the floor. Place your palms near your head, about shoulder-width apart. Keeping your back as relaxed as possible and keeping your hips on the floor, slowly straighten your arms to raise the top  half of your body and lift your shoulders. Do not use your back muscles to raise your upper torso. You may adjust the placement of your hands to make yourself more comfortable. Hold this position for 5 seconds while you keep your back relaxed. Slowly return to lying flat on the floor.  Bridges Repeat these steps 10 times: Lie on your back on a firm surface. Bend your knees so they are pointing toward the ceiling and your feet are flat on the floor. Your arms should be flat at your sides, next to your body. Tighten your buttocks muscles and lift your buttocks off the floor until your waist is at almost the same height as your knees. You should feel the muscles working in your buttocks and the back of your thighs. If you do not feel these muscles, slide your feet 1-2 inches farther away from your buttocks. Hold this position for 3-5 seconds. Slowly lower your hips to the starting position, and allow your buttocks muscles to relax completely. If this exercise is too easy, try doing it with your arms crossed over yourchest. Abdominal crunches Repeat these steps 5-10 times: Lie on your back on a firm bed or the floor with your legs extended. Bend your knees so they are pointing toward the ceiling and your feet are flat on the floor. Cross your arms over your chest. Tip your chin slightly toward your chest without bending your neck. Tighten your abdominal muscles and slowly raise your trunk (torso) high enough to lift your shoulder blades a tiny bit off the floor. Avoid raising your torso higher than that because it can put too much stress on your low back and does not help to strengthen your abdominal muscles. Slowly return to your starting position. Back lifts Repeat these steps 5-10 times: Lie on your abdomen (face-down) with your arms at your sides, and rest your forehead on the floor. Tighten the muscles in your legs and your buttocks. Slowly lift your chest off the floor while you keep your  hips pressed to the floor. Keep the back of your head in line with the curve in your back. Your eyes should be looking at the floor. Hold this position for 3-5 seconds. Slowly return to your starting position. Contact a health care provider if: Your back pain or discomfort gets much worse when you do an exercise. Your worsening back pain or discomfort does not lessen within 2 hours after you exercise. If you have any of these problems, stop doing these exercises right away. Do not do them again unless your health care provider says that you can. Get help   right away if: You develop sudden, severe back pain. If this happens, stop doing the exercises right away. Do not do them again unless your health care provider says that you can. This information is not intended to replace advice given to you by your health care provider. Make sure you discuss any questions you have with your healthcare provider. Document Revised: 08/17/2018 Document Reviewed: 01/12/2018 Elsevier Patient Education  2022 [employer]  

## 2020-12-18 ENCOUNTER — Ambulatory Visit: Payer: Medicare Other | Admitting: Rheumatology

## 2021-05-07 NOTE — Progress Notes (Signed)
Office Visit Note  Patient: Gregory Taylor             Date of Birth: 09-04-1950           MRN: [id number]             PCP: Kristen Loader, FNP Referring: Kristen Loader, FNP Visit Date: 05/21/2021 Occupation: _0 @  Subjective:  Joint stiffness  History of Present Illness: Gregory Taylor is a 71 y.o. male returns today after his last visit on November 20, 2020.  He continues to have some stiffness in his cervical and lumbar spine.  He has been playing tennis about 4 times a week, 10 hours/week.  He states he was recently playing guitar and had some stiffness in his neck after that but the symptoms resolved quickly.  He has some stiffness in his hands.  He has not had any joint swelling since his last visit.  He had 1 episode of foot and ankle swelling which responded to prednisone in the past.  Activities of Daily Living:  Patient reports morning stiffness for 5 minutes.   Patient Denies nocturnal pain.  Difficulty dressing/grooming: Denies Difficulty climbing stairs: Denies Difficulty getting out of chair: Denies Difficulty using hands for taps, buttons, cutlery, and/or writing: Denies  Review of Systems  Constitutional:  Negative for fatigue.  HENT:  Negative for mouth sores, mouth dryness and nose dryness.   Eyes:  Negative for pain, itching and dryness.  Respiratory:  Negative for shortness of breath and difficulty breathing.   Cardiovascular:  Negative for chest pain and palpitations.  Gastrointestinal:  Negative for blood in stool, constipation and diarrhea.  Endocrine: Negative for increased urination.  Genitourinary:  Negative for difficulty urinating.  Musculoskeletal:  Positive for morning stiffness. Negative for joint pain, joint pain, joint swelling, myalgias, muscle tenderness and myalgias.  Skin:  Negative for color change, rash and redness.  Allergic/Immunologic: Negative for susceptible to infections.  Neurological:  Negative for dizziness, numbness, headaches,  memory loss and weakness.  Hematological:  Negative for bruising/bleeding tendency.  Psychiatric/Behavioral:  Negative for confusion.    PMFS History:  Patient Active Problem List   Diagnosis Date Noted   Dyspnea 08/09/2013   HYPERTENSION 10/04/2007   VARICOSE VEINS, LOWER EXTREMITIES 10/04/2007    Past Medical History:  Diagnosis Date   Hypertension    VARICOSE VEINS, LOWER EXTREMITIES 10/04/2007   Qualifier: Diagnosis of  By: Leanne Chang MD, Bruce      Family History  Problem Relation Age of Onset   Stroke Mother    Cancer Father    Heart attack Father        x3; first MI age 46   Hypertension Other    Healthy Daughter    Healthy Daughter    Past Surgical History:  Procedure Laterality Date   KNEE ARTHROSCOPY Right    NASAL SEPTUM SURGERY     TONSILLECTOMY     Social History   Social History Narrative   Not on file   Immunization History  Administered Date(s) Administered   Influenza Whole 02/27/2009   Influenza-Unspecified 01/17/2014   PFIZER(Purple Top)SARS-COV-2 Vaccination 05/24/2019, 06/21/2019, 01/25/2020   Td 04/11/2009   Zoster, Live 12/21/2011     Objective: Vital Signs: BP (!) 128/91 (BP Location: Left Arm, Patient Position: Sitting, Cuff Size: Normal)    Pulse 62    Ht _1  (1.753 m)    Wt 151 lb 12.8 oz (68.9 kg)    BMI 22.42 kg/m  Physical Exam Vitals and nursing note reviewed.  Constitutional:      Appearance: He is well-developed.  HENT:     Head: Normocephalic and atraumatic.  Eyes:     Conjunctiva/sclera: Conjunctivae normal.     Pupils: Pupils are equal, round, and reactive to light.  Cardiovascular:     Rate and Rhythm: Normal rate and regular rhythm.     Heart sounds: Normal heart sounds.  Pulmonary:     Effort: Pulmonary effort is normal.     Breath sounds: Normal breath sounds.  Abdominal:     General: Bowel sounds are normal.     Palpations: Abdomen is soft.  Musculoskeletal:     Cervical back: Normal range of motion and neck  supple.  Skin:    General: Skin is warm and dry.     Capillary Refill: Capillary refill takes less than 2 seconds.  Neurological:     Mental Status: He is alert and oriented to person, place, and time.  Psychiatric:        Behavior: Behavior normal.     Musculoskeletal Exam: He had good range of motion of the cervical spine with some stiffness.  He had lumbar scoliosis with good mobility.  Shoulder joints, elbow joints, wrist joints and MCPs PIPs and DIPs with good range of motion.  He had bilateral DIP thickening.  Hip joints and knee joints with good range of motion.  There was no tenderness over ankles or MTPs.  There was no evidence of plantar fasciitis or Achilles tendinitis.  CDAI Exam: CDAI Score: -- Patient Global: --; Provider Global: -- Swollen: --; Tender: -- Joint Exam 05/21/2021   No joint exam has been documented for this visit   There is currently no information documented on the homunculus. Go to the Rheumatology activity and complete the homunculus joint exam.  Investigation: No additional findings.  Imaging: No results found.  Recent Labs: Lab Results  Component Value Date   WBC 9.3 08/09/2013   HGB 14.9 08/09/2013   PLT 271.0 08/09/2013   NA 139 08/09/2013   K 3.7 08/09/2013   CL 102 08/09/2013   CO2 29 08/09/2013   GLUCOSE 77 08/09/2013   BUN 19 08/09/2013   CREATININE 1.3 08/09/2013   BILITOT 0.7 08/09/2013   ALKPHOS 54 08/09/2013   AST 21 08/09/2013   ALT 16 08/09/2013   PROT 6.5 08/09/2013   ALBUMIN 3.9 08/09/2013   CALCIUM 9.3 08/09/2013   GFRAA 88 04/02/2008     Speciality Comments: No specialty comments available.  Procedures:  No procedures performed Allergies: Patient has no known allergies.   Assessment / Plan:     Visit Diagnoses: DDD (degenerative disc disease), cervical - X-rays obtained at the last visit showed multilevel spondylosis and facet joint arthropathy.  X-ray findings were reviewed with the patient.  A handout on  exercises was given.  He  was referred to physical therapy.  DDD (degenerative disc disease), lumbar -scoliosis, multilevel spondylosis and facet joint arthropathy was noted.  X-ray findings were reviewed with the patient.  A handout on exercises was given.  Core strength exercises were demonstrated in the office.  He was referred to physical therapy.  Primary osteoarthritis of both hands - Clinical findings are consistent with osteoarthritis.  He had no synovitis on examination.  Hand exercises were demonstrated and a handout on hand exercises was given.  Chronic pain of right knee - History of intermittent pain in the knee joint.  No warmth swelling or effusion  was noted.  He plays tennis 10 hours/week.  Right ankle swelling - He had 1 episode of right ankle joint swelling in May 2022.  It responded to prednisone taper and steroid shot.  He denies any recurrence of joint swelling.  Elevated C-reactive protein (CRP) - Sep 20, 2020 CRP 42 ESR normal, ANA negative, RF negative  Essential hypertension-blood pressure was mildly elevated.  Mild acid reflux  Asymptomatic varicose veins of both lower extremities  History of BPH  Orders: Orders Placed This Encounter  Procedures   Ambulatory referral to Physical Therapy   No orders of the defined types were placed in this encounter.  Total time spent in reviewing x-rays, counseling, discussing in office exercises and documenting was 30 minutes.  Follow-Up Instructions: Return if symptoms worsen or fail to improve, for Osteoarthritis.   Bo Merino, MD  Note - This record has been created using Editor, commissioning.  Chart creation errors have been sought, but may not always  have been located. Such creation errors do not reflect on  the standard of medical care.

## 2021-05-21 ENCOUNTER — Encounter: Payer: Self-pay | Admitting: Rheumatology

## 2021-05-21 ENCOUNTER — Ambulatory Visit: Payer: Medicare Other | Admitting: Rheumatology

## 2021-05-21 ENCOUNTER — Other Ambulatory Visit: Payer: Self-pay

## 2021-05-21 VITALS — BP 128/91 | HR 62 | Ht 69.0 in | Wt 151.8 lb

## 2021-05-21 DIAGNOSIS — M25561 Pain in right knee: Secondary | ICD-10-CM | POA: Diagnosis not present

## 2021-05-21 DIAGNOSIS — M5136 Other intervertebral disc degeneration, lumbar region: Secondary | ICD-10-CM

## 2021-05-21 DIAGNOSIS — K219 Gastro-esophageal reflux disease without esophagitis: Secondary | ICD-10-CM

## 2021-05-21 DIAGNOSIS — M542 Cervicalgia: Secondary | ICD-10-CM

## 2021-05-21 DIAGNOSIS — M5030 Other cervical disc degeneration, unspecified cervical region: Secondary | ICD-10-CM | POA: Diagnosis not present

## 2021-05-21 DIAGNOSIS — M19041 Primary osteoarthritis, right hand: Secondary | ICD-10-CM | POA: Diagnosis not present

## 2021-05-21 DIAGNOSIS — M19042 Primary osteoarthritis, left hand: Secondary | ICD-10-CM

## 2021-05-21 DIAGNOSIS — Z87438 Personal history of other diseases of male genital organs: Secondary | ICD-10-CM

## 2021-05-21 DIAGNOSIS — G8929 Other chronic pain: Secondary | ICD-10-CM

## 2021-05-21 DIAGNOSIS — M25471 Effusion, right ankle: Secondary | ICD-10-CM

## 2021-05-21 DIAGNOSIS — I10 Essential (primary) hypertension: Secondary | ICD-10-CM

## 2021-05-21 DIAGNOSIS — R7982 Elevated C-reactive protein (CRP): Secondary | ICD-10-CM

## 2021-05-21 DIAGNOSIS — I8393 Asymptomatic varicose veins of bilateral lower extremities: Secondary | ICD-10-CM

## 2021-05-21 NOTE — Patient Instructions (Signed)
Back Exercises The following exercises strengthen the muscles that help to support the trunk (torso) and back. They also help to keep the lower back flexible. Doing these exercises can help to prevent or lessen existing low back pain. If you have back pain or discomfort, try doing these exercises 2-3 times each day or as told by your health care provider. As your pain improves, do them once each day, but increase the number of times that you repeat the steps for each exercise (do more repetitions). To prevent the recurrence of back pain, continue to do these exercises once each day or as told by your health care provider. Do exercises exactly as told by your health care provider and adjust them as directed. It is normal to feel mild stretching, pulling, tightness, or discomfort as you do these exercises, but you should stop right away if you feel sudden pain or your pain gets worse. Exercises Single knee to chest Repeat these steps 3-5 times for each leg: Lie on your back on a firm bed or the floor with your legs extended. Bring one knee to your chest. Your other leg should stay extended and in contact with the floor. Hold your knee in place by grabbing your knee or thigh with both hands and hold. Pull on your knee until you feel a gentle stretch in your lower back or buttocks. Hold the stretch for 10-30 seconds. Slowly release and straighten your leg.  Pelvic tilt Repeat these steps 5-10 times: Lie on your back on a firm bed or the floor with your legs extended. Bend your knees so they are pointing toward the ceiling and your feet are flat on the floor. Tighten your lower abdominal muscles to press your lower back against the floor. This motion will tilt your pelvis so your tailbone points up toward the ceiling instead of pointing to your feet or the floor. With gentle tension and even breathing, hold this position for 5-10 seconds.  Cat-cow Repeat these steps until your lower back becomes  more flexible: Get into a hands-and-knees position on a firm bed or the floor. Keep your hands under your shoulders, and keep your knees under your hips. You may place padding under your knees for comfort. Let your head hang down toward your chest. Contract your abdominal muscles and point your tailbone toward the floor so your lower back becomes rounded like the back of a cat. Hold this position for 5 seconds. Slowly lift your head, let your abdominal muscles relax, and point your tailbone up toward the ceiling so your back forms a sagging arch like the back of a cow. Hold this position for 5 seconds.  Press-ups Repeat these steps 5-10 times: Lie on your abdomen (face-down) on a firm bed or the floor. Place your palms near your head, about shoulder-width apart. Keeping your back as relaxed as possible and keeping your hips on the floor, slowly straighten your arms to raise the top half of your body and lift your shoulders. Do not use your back muscles to raise your upper torso. You may adjust the placement of your hands to make yourself more comfortable. Hold this position for 5 seconds while you keep your back relaxed. Slowly return to lying flat on the floor.  Bridges Repeat these steps 10 times: Lie on your back on a firm bed or the floor. Bend your knees so they are pointing toward the ceiling and your feet are flat on the floor. Your arms should be flat  at your sides, next to your body. Tighten your buttocks muscles and lift your buttocks off the floor until your waist is at almost the same height as your knees. You should feel the muscles working in your buttocks and the back of your thighs. If you do not feel these muscles, slide your feet 1-2 inches (2.5-5 cm) farther away from your buttocks. Hold this position for 3-5 seconds. Slowly lower your hips to the starting position, and allow your buttocks muscles to relax completely. If this exercise is too easy, try doing it with your arms  crossed over your chest. Abdominal crunches Repeat these steps 5-10 times: Lie on your back on a firm bed or the floor with your legs extended. Bend your knees so they are pointing toward the ceiling and your feet are flat on the floor. Cross your arms over your chest. Tip your chin slightly toward your chest without bending your neck. Tighten your abdominal muscles and slowly raise your torso high enough to lift your shoulder blades a tiny bit off the floor. Avoid raising your torso higher than that because it can put too much stress on your lower back and does not help to strengthen your abdominal muscles. Slowly return to your starting position.  Back lifts Repeat these steps 5-10 times: Lie on your abdomen (face-down) with your arms at your sides, and rest your forehead on the floor. Tighten the muscles in your legs and your buttocks. Slowly lift your chest off the floor while you keep your hips pressed to the floor. Keep the back of your head in line with the curve in your back. Your eyes should be looking at the floor. Hold this position for 3-5 seconds. Slowly return to your starting position.  Contact a health care provider if: Your back pain or discomfort gets much worse when you do an exercise. Your worsening back pain or discomfort does not lessen within 2 hours after you exercise. If you have any of these problems, stop doing these exercises right away. Do not do them again unless your health care provider says that you can. Get help right away if: You develop sudden, severe back pain. If this happens, stop doing the exercises right away. Do not do them again unless your health care provider says that you can. This information is not intended to replace advice given to you by your health care provider. Make sure you discuss any questions you have with your health care provider. Cervical Strain and Sprain Rehab Ask your health care provider which exercises are safe for you. Do  exercises exactly as told by your health care provider and adjust them as directed. It is normal to feel mild stretching, pulling, tightness, or discomfort as you do these exercises. Stop right away if you feel sudden pain or your pain gets worse. Do not begin these exercises until told by your health care provider. Stretching and range-of-motion exercises Cervical side bending  Using good posture, sit on a stable chair or stand up. Without moving your shoulders, slowly tilt your left / right ear to your shoulder until you feel a stretch in the opposite side neck muscles. You should be looking straight ahead. Hold for __________ seconds. Repeat with the other side of your neck. Repeat __________ times. Complete this exercise __________ times a day. Cervical rotation  Using good posture, sit on a stable chair or stand up. Slowly turn your head to the side as if you are looking over your left /  right shoulder. Keep your eyes level with the ground. Stop when you feel a stretch along the side and the back of your neck. Hold for __________ seconds. Repeat this by turning to your other side. Repeat __________ times. Complete this exercise __________ times a day. Thoracic extension and pectoral stretch Roll a towel or a small blanket so it is about 4 inches (10 cm) in diameter. Lie down on your back on a firm surface. Put the towel lengthwise, under your spine in the middle of your back. It should not be under your shoulder blades. The towel should line up with your spine from your middle back to your lower back. Put your hands behind your head and let your elbows fall out to your sides. Hold for __________ seconds. Repeat __________ times. Complete this exercise __________ times a day. Strengthening exercises Isometric upper cervical flexion Lie on your back with a thin pillow behind your head and a small rolled-up towel under your neck. Gently tuck your chin toward your chest and nod your head  down to look toward your feet. Do not lift your head off the pillow. Hold for __________ seconds. Release the tension slowly. Relax your neck muscles completely before you repeat this exercise. Repeat __________ times. Complete this exercise __________ times a day. Isometric cervical extension  Stand about 6 inches (15 cm) away from a wall, with your back facing the wall. Place a soft object, about 6-8 inches (15-20 cm) in diameter, between the back of your head and the wall. A soft object could be a small pillow, a ball, or a folded towel. Gently tilt your head back and press into the soft object. Keep your jaw and forehead relaxed. Hold for __________ seconds. Release the tension slowly. Relax your neck muscles completely before you repeat this exercise. Repeat __________ times. Complete this exercise __________ times a day. Posture and body mechanics Body mechanics refers to the movements and positions of your body while you do your daily activities. Posture is part of body mechanics. Good posture and healthy body mechanics can help to relieve stress in your body's tissues and joints. Good posture means that your spine is in its natural S-curve position (your spine is neutral), your shoulders are pulled back slightly, and your head is not tipped forward. The following are general guidelines for applying improved posture and body mechanics to your everyday activities. Sitting  When sitting, keep your spine neutral and keep your feet flat on the floor. Use a footrest, if necessary, and keep your thighs parallel to the floor. Avoid rounding your shoulders, and avoid tilting your head forward. When working at a desk or a computer, keep your desk at a height where your hands are slightly lower than your elbows. Slide your chair under your desk so you are close enough to maintain good posture. When working at a computer, place your monitor at a height where you are looking straight ahead and you do  not have to tilt your head forward or downward to look at the screen. Standing  When standing, keep your spine neutral and keep your feet about hip-width apart. Keep a slight bend in your knees. Your ears, shoulders, and hips should line up. When you do a task in which you stand in one place for a long time, place one foot up on a stable object that is 2-4 inches (5-10 cm) high, such as a footstool. This helps keep your spine neutral. Resting When lying down and resting, avoid  positions that are most painful for you. Try to support your neck in a neutral position. You can use a contour pillow or a small rolled-up towel. Your pillow should support your neck but not push on it. This information is not intended to replace advice given to you by your health care provider. Make sure you discuss any questions you have with your health care provider. Document Revised: 08/02/2018 Document Reviewed: 01/11/2018 [employer] Patient Education  2022 [employer] Hand Exercises Hand exercises can be helpful for almost anyone. These exercises can strengthen the hands, improve flexibility and movement, and increase blood flow to the hands. These results can make work and daily tasks easier. Hand exercises can be especially helpful for people who have joint pain from arthritis or have nerve damage from overuse (carpal tunnel syndrome). These exercises can also help people who have injured a hand. Exercises Most of these hand exercises are gentle stretching and motion exercises. It is usually safe to do them often throughout the day. Warming up your hands before exercise may help to reduce stiffness. You can do this with gentle massage or by placing your hands in warm water for 10-15 minutes. It is normal to feel some stretching, pulling, tightness, or mild discomfort as you begin new exercises. This will gradually improve. Stop an exercise right away if you feel sudden, severe pain or your pain gets worse. Ask your  health care provider which exercises are best for you. Knuckle bend or "claw" fist  Stand or sit with your arm, hand, and all five fingers pointed straight up. Make sure to keep your wrist straight during the exercise. Gently bend your fingers down toward your palm until the tips of your fingers are touching the top of your palm. Keep your big knuckle straight and just bend the small knuckles in your fingers. Hold this position for __________ seconds. Straighten (extend) your fingers back to the starting position. Repeat this exercise 5-10 times with each hand. Full finger fist  Stand or sit with your arm, hand, and all five fingers pointed straight up. Make sure to keep your wrist straight during the exercise. Gently bend your fingers into your palm until the tips of your fingers are touching the middle of your palm. Hold this position for __________ seconds. Extend your fingers back to the starting position, stretching every joint fully. Repeat this exercise 5-10 times with each hand. Straight fist Stand or sit with your arm, hand, and all five fingers pointed straight up. Make sure to keep your wrist straight during the exercise. Gently bend your fingers at the big knuckle, where your fingers meet your hand, and the middle knuckle. Keep the knuckle at the tips of your fingers straight and try to touch the bottom of your palm. Hold this position for __________ seconds. Extend your fingers back to the starting position, stretching every joint fully. Repeat this exercise 5-10 times with each hand. Tabletop  Stand or sit with your arm, hand, and all five fingers pointed straight up. Make sure to keep your wrist straight during the exercise. Gently bend your fingers at the big knuckle, where your fingers meet your hand, as far down as you can while keeping the small knuckles in your fingers straight. Think of forming a tabletop with your fingers. Hold this position for __________  seconds. Extend your fingers back to the starting position, stretching every joint fully. Repeat this exercise 5-10 times with each hand. Finger spread  Place your hand flat on  a table with your palm facing down. Make sure your wrist stays straight as you do this exercise. Spread your fingers and thumb apart from each other as far as you can until you feel a gentle stretch. Hold this position for __________ seconds. Bring your fingers and thumb tight together again. Hold this position for __________ seconds. Repeat this exercise 5-10 times with each hand. Making circles  Stand or sit with your arm, hand, and all five fingers pointed straight up. Make sure to keep your wrist straight during the exercise. Make a circle by touching the tip of your thumb to the tip of your index finger. Hold for __________ seconds. Then open your hand wide. Repeat this motion with your thumb and each finger on your hand. Repeat this exercise 5-10 times with each hand. Thumb motion  Sit with your forearm resting on a table and your wrist straight. Your thumb should be facing up toward the ceiling. Keep your fingers relaxed as you move your thumb. Lift your thumb up as high as you can toward the ceiling. Hold for __________ seconds. Bend your thumb across your palm as far as you can, reaching the tip of your thumb for the small finger (pinkie) side of your palm. Hold for __________ seconds. Repeat this exercise 5-10 times with each hand. Grip strengthening  Hold a stress ball or other soft ball in the middle of your hand. Slowly increase the pressure, squeezing the ball as much as you can without causing pain. Think of bringing the tips of your fingers into the middle of your palm. All of your finger joints should bend when doing this exercise. Hold your squeeze for __________ seconds, then relax. Repeat this exercise 5-10 times with each hand. Contact a health care provider if: Your hand pain or discomfort  gets much worse when you do an exercise. Your hand pain or discomfort does not improve within 2 hours after you exercise. If you have any of these problems, stop doing these exercises right away. Do not do them again unless your health care provider says that you can. Get help right away if: You develop sudden, severe hand pain or swelling. If this happens, stop doing these exercises right away. Do not do them again unless your health care provider says that you can. This information is not intended to replace advice given to you by your health care provider. Make sure you discuss any questions you have with your health care provider. Document Revised: 07/31/2020 Document Reviewed: 07/31/2020 [employer] Patient Education  2022 [employer].

## 2021-11-14 ENCOUNTER — Emergency Department (HOSPITAL_BASED_OUTPATIENT_CLINIC_OR_DEPARTMENT_OTHER): Payer: Medicare Other

## 2021-11-14 ENCOUNTER — Emergency Department (HOSPITAL_BASED_OUTPATIENT_CLINIC_OR_DEPARTMENT_OTHER)
Admission: EM | Admit: 2021-11-14 | Discharge: 2021-11-14 | Disposition: A | Discharge status: Home / Self Care | Payer: Medicare Other | Attending: Emergency Medicine | Admitting: Emergency Medicine

## 2021-11-14 ENCOUNTER — Encounter (HOSPITAL_BASED_OUTPATIENT_CLINIC_OR_DEPARTMENT_OTHER): Payer: Self-pay | Admitting: Emergency Medicine

## 2021-11-14 ENCOUNTER — Other Ambulatory Visit: Payer: Self-pay

## 2021-11-14 DIAGNOSIS — M62830 Muscle spasm of back: Secondary | ICD-10-CM | POA: Insufficient documentation

## 2021-11-14 DIAGNOSIS — R109 Unspecified abdominal pain: Secondary | ICD-10-CM | POA: Diagnosis present

## 2021-11-14 DIAGNOSIS — I10 Essential (primary) hypertension: Secondary | ICD-10-CM | POA: Diagnosis not present

## 2021-11-14 LAB — CBC WITH DIFFERENTIAL/PLATELET
Abs Immature Granulocytes: 0.04 10*3/uL (ref 0.00–0.07)
Basophils Absolute: 0 10*3/uL (ref 0.0–0.1)
Basophils Relative: 0 %
Eosinophils Absolute: 0.1 10*3/uL (ref 0.0–0.5)
Eosinophils Relative: 1 %
HCT: 45.8 % (ref 39.0–52.0)
Hemoglobin: 15.6 g/dL (ref 13.0–17.0)
Immature Granulocytes: 0 %
Lymphocytes Relative: 26 %
Lymphs Abs: 3.7 10*3/uL (ref 0.7–4.0)
MCH: 28.1 pg (ref 26.0–34.0)
MCHC: 34.1 g/dL (ref 30.0–36.0)
MCV: 82.4 fL (ref 80.0–100.0)
Monocytes Absolute: 1.4 10*3/uL — ABNORMAL HIGH (ref 0.1–1.0)
Monocytes Relative: 10 %
Neutro Abs: 8.8 10*3/uL — ABNORMAL HIGH (ref 1.7–7.7)
Neutrophils Relative %: 63 %
Platelets: 317 10*3/uL (ref 150–400)
RBC: 5.56 MIL/uL (ref 4.22–5.81)
RDW: 13.8 % (ref 11.5–15.5)
WBC: 14 10*3/uL — ABNORMAL HIGH (ref 4.0–10.5)
nRBC: 0 % (ref 0.0–0.2)

## 2021-11-14 LAB — URINALYSIS, ROUTINE W REFLEX MICROSCOPIC
Bilirubin Urine: NEGATIVE
Glucose, UA: NEGATIVE mg/dL
Ketones, ur: NEGATIVE mg/dL
Leukocytes,Ua: NEGATIVE
Nitrite: NEGATIVE
Protein, ur: NEGATIVE mg/dL
Specific Gravity, Urine: 1.011 (ref 1.005–1.030)
pH: 5.5 (ref 5.0–8.0)

## 2021-11-14 LAB — COMPREHENSIVE METABOLIC PANEL
ALT: 24 U/L (ref 0–44)
AST: 21 U/L (ref 15–41)
Albumin: 4.2 g/dL (ref 3.5–5.0)
Alkaline Phosphatase: 57 U/L (ref 38–126)
Anion gap: 9 (ref 5–15)
BUN: 20 mg/dL (ref 8–23)
CO2: 28 mmol/L (ref 22–32)
Calcium: 9.5 mg/dL (ref 8.9–10.3)
Chloride: 101 mmol/L (ref 98–111)
Creatinine, Ser: 1.12 mg/dL (ref 0.61–1.24)
GFR, Estimated: >60 mL/min (ref >60)
Glucose, Bld: 85 mg/dL (ref 70–99)
Potassium: 3.6 mmol/L (ref 3.5–5.1)
Sodium: 138 mmol/L (ref 135–145)
Total Bilirubin: 0.6 mg/dL (ref 0.3–1.2)
Total Protein: 6.3 g/dL — ABNORMAL LOW (ref 6.5–8.1)

## 2021-11-14 MED ORDER — KETOROLAC TROMETHAMINE 15 MG/ML IJ SOLN
INTRAMUSCULAR | Status: AC
  Filled 2021-11-14: qty 1

## 2021-11-14 MED ORDER — METHOCARBAMOL 500 MG PO TABS: 1000.0000 mg | ORAL_TABLET | Freq: Two times a day (BID) | ORAL | 0 refills | Status: AC

## 2021-11-14 MED ORDER — KETOROLAC TROMETHAMINE 15 MG/ML IJ SOLN
15.0000 mg | Freq: Once | INTRAMUSCULAR | Status: AC
  Administered 2021-11-14: 15 mg via INTRAVENOUS

## 2021-11-14 NOTE — Discharge Instructions (Signed)
You may use over-the-counter Motrin (Ibuprofen), Acetaminophen (Tylenol), topical muscle creams such as SalonPas, Icy Hot, Bengay, etc. Please stretch, apply ice or heat (whichever helps), and have massage therapy for additional assistance.  

## 2021-11-14 NOTE — ED Triage Notes (Signed)
  Patient comes in with R flank pain and possible kidney stone.  Patient states the pain started Monday and he assumed it was from playing tennis so he tried to treat at home with ibuprofen, prednisone, and heat.  Pain got progressively worse after long car ride and patient states it felt like hot knife sticking in his R lower back.  Patient states pain starts on R flank and comes around to groin.  Pain 10/10, sharp/stabbing.  Patient states he has hx of kidney stones.

## 2021-11-14 NOTE — ED Provider Notes (Signed)
[facility] EMERGENCY DEPT Provider Note  CSN: [id number] Arrival date & time: 11/14/21 0009  Chief Complaint(s) Flank Pain  HPI Gregory Taylor is a 71 y.o. male with a past medical history below who presents to the emergency department with several days of right-sided flank pain initially mild in nature and worse with movement and palpation.  He reports that he thought he might of pulled a muscle and was treating it as such with, eyes, he and steroids.  Pain did not subside and [place] he was not able to find a comfortable spot which made him concerned he might have a renal stone as he did 20 years ago.  He denied any change in urine color or dysuria.  No associated nausea or vomiting.  No other physical complaints.  The history is provided by the patient.    Past Medical History Past Medical History:  Diagnosis Date   Hypertension    VARICOSE VEINS, LOWER EXTREMITIES 10/04/2007   Qualifier: Diagnosis of  By: Cato Mulligan MD, Bruce     Patient Active Problem List   Diagnosis Date Noted   Dyspnea 08/09/2013   HYPERTENSION 10/04/2007   VARICOSE VEINS, LOWER EXTREMITIES 10/04/2007   Home Medication(s) Prior to Admission medications   Medication Sig Start Date End Date Taking? Authorizing Provider  methocarbamol (ROBAXIN) 500 MG tablet Take 2 tablets (1,000 mg total) by mouth 2 (two) times daily for 10 days. 11/14/21 11/24/21 Yes Priscillia Fouch, Amadeo Garnet, MD  triamterene-hydrochlorothiazide [facility]) 37.5-25 MG per tablet TAKE ONE TABLET BY MOUTH DAILY 02/23/14   Lewayne Bunting, MD                                                                                                                                    Allergies Patient has no known allergies.  Review of Systems Review of Systems As noted in HPI  Physical Exam Vital Signs  I have reviewed the triage vital signs BP (!) 189/91 (BP Location: Right Arm)   Pulse (!) 56   Temp 98.9 F  (37.2 C) (Oral)   Resp 18   SpO2 97%   Physical Exam Vitals reviewed.  Constitutional:      General: He is not in acute distress.    Appearance: He is well-developed. He is not diaphoretic.  HENT:     Head: Normocephalic and atraumatic.     Right Ear: External ear normal.     Left Ear: External ear normal.     Nose: Nose normal.     Mouth/Throat:     Mouth: Mucous membranes are moist.  Eyes:     General: No scleral icterus.    Conjunctiva/sclera: Conjunctivae normal.  Neck:     Trachea: Phonation normal.  Cardiovascular:     Rate and Rhythm: Normal rate and regular rhythm.  Pulmonary:     Effort: Pulmonary effort is normal. No respiratory distress.  Breath sounds: No stridor.  Abdominal:     General: There is no distension.     Tenderness: There is no abdominal tenderness. There is no guarding or rebound.  Musculoskeletal:        General: Normal range of motion.     Cervical back: Normal range of motion.     Lumbar back: Spasms and tenderness present. No bony tenderness.  Neurological:     Mental Status: He is alert and oriented to person, place, and time.  Psychiatric:        Behavior: Behavior normal.     ED Results and Treatments Labs (all labs ordered are listed, but only abnormal results are displayed) Labs Reviewed  URINALYSIS, ROUTINE W REFLEX MICROSCOPIC - Abnormal; Notable for the following components:      Result Value   Hgb urine dipstick TRACE (*)    All other components within normal limits  CBC WITH DIFFERENTIAL/PLATELET - Abnormal; Notable for the following components:   WBC 14.0 (*)    Neutro Abs 8.8 (*)    Monocytes Absolute 1.4 (*)    All other components within normal limits  COMPREHENSIVE METABOLIC PANEL - Abnormal; Notable for the following components:   Total Protein 6.3 (*)    All other components within normal limits                                                                                                                          EKG  EKG Interpretation  Date/Time:    Ventricular Rate:    PR Interval:    QRS Duration:   QT Interval:    QTC Calculation:   R Axis:     Text Interpretation:         Radiology CT Renal Stone Study  Result Date: 11/14/2021 CLINICAL DATA:  Flank pain.  Evaluate for kidney stone. EXAM: CT ABDOMEN AND PELVIS WITHOUT CONTRAST TECHNIQUE: Multidetector CT imaging of the abdomen and pelvis was performed following the standard protocol without IV contrast. RADIATION DOSE REDUCTION: This exam was performed according to the departmental dose-optimization program which includes automated exposure control, adjustment of the mA and/or kV according to patient size and/or use of iterative reconstruction technique. COMPARISON:  None FINDINGS: Lower chest: No acute abnormality.3 mm left lower lobe lung nodule identified, image 12/4. Hepatobiliary: No focal liver abnormality is seen. No gallstones, gallbladder wall thickening, or biliary dilatation. Pancreas: Unremarkable. No pancreatic ductal dilatation or surrounding inflammatory changes. Spleen: Normal in size without focal abnormality. Adrenals/Urinary Tract: The adrenal glands are unremarkable. No right renal calculi. Upper pole left kidney stone measures 1.2 cm. Inferior pole left kidney stone measures 0.7 cm. No mass or hydronephrosis identified bilaterally./2 no hydroureter or ureteral lithiasis identified. No bladder calculi noted. Stomach/Bowel: Stomach appears normal. The appendix is visualized and is within normal limits. No pathologic dilatation of the large or small bowel loops. Moderate stool burden scratch set there is a mild to moderate stool burden noted within the  colon. No bowel wall thickening, inflammation, or distension. Vascular/Lymphatic: Aortic atherosclerosis. No aneurysm. No signs of abdominopelvic adenopathy. Reproductive: Mild prostate gland enlargement. Other: No free fluid or fluid collections. Musculoskeletal: No acute or  suspicious osseous findings. Lumbar degenerative disc disease. This is most advanced at L2-3. IMPRESSION: 1. No acute findings within the abdomen or pelvis. 2. Nonobstructing left nephrolithiasis. 3. Moderate stool burden noted within the colon. Correlate for any clinical symptoms of constipation. 4. 3 mm left solid pulmonary nodule. Per Fleischner Society Guidelines, no routine follow-up imaging is recommended. These guidelines do not apply to immunocompromised patients and patients with cancer. Follow up in patients with significant comorbidities as clinically warranted. For lung cancer screening, adhere to Lung-RADS guidelines. Reference: Radiology. 2017; 284(1):228-43. 5. Aortic Atherosclerosis (ICD10-I70.0). Electronically Signed   By: Signa Kell M.D.   On: 11/14/2021 06:42    Pertinent labs & imaging results that were available during my care of the patient were reviewed by me and considered in my medical decision making (see MDM for details).  Medications Ordered in ED Medications  ketorolac (TORADOL) 15 MG/ML injection (has no administration in time range)  ketorolac (TORADOL) 15 MG/ML injection 15 mg (15 mg Intravenous Given 11/14/21 0622)                                                                                                                                     Procedures Procedures  (including critical care time)  Medical Decision Making / ED Course    Complexity of Problem:  Patient's presenting problem/concern, DDX, and MDM listed below: Right-sided flank pain Patient has tenderness to palpation and pain with straight leg raise which makes me is more suspicious for muscle strain/spasm. Given his past medical history of renal stones, will also obtain a stone study and work-up.   He does not have any other abdominal discomfort concerning for other intra-abdominal processes. He is not having any midline tenderness concerning for osteomyelitis/discitis and patient is low  risk for this anyhow.    Complexity of Data:   Laboratory Tests ordered listed below with my independent interpretation: CBC with leukocytosis.  Likely from recent prednisone use but will assess for urinary tract infection. Metabolic panel without significant electrolyte derangements or renal sufficiency.  No bili obstruction. UA with mild hematuria without evidence of infection.   Imaging Studies ordered listed below with my independent interpretation: CT stone study without right-sided stone.  He does have nonobstructing left renal stones which is likely where his hematuria is coming from. No other acute processes.     ED Course:    Assessment, Add'l Intervention, and Reassessment: Right-sided flank pain Work-up reassuring and negative for urinary tract infection. No visible stones on CT scan. Patient may had a small stone not visible on CT scan or this may be muscular in nature.    Final Clinical Impression(s) / ED Diagnoses Final diagnoses:  Right flank pain  The patient appears reasonably screened and/or stabilized for discharge and I doubt any other medical condition or other [facility] requiring further screening, evaluation, or treatment in the ED at this time prior to discharge. Safe for discharge with strict return precautions.  Disposition: Discharge  Condition: Good  I have discussed the results, Dx and Tx plan with the patient/family who expressed understanding and agree(s) with the plan. Discharge instructions discussed at length. The patient/family was given strict return precautions who verbalized understanding of the instructions. No further questions at time of discharge.    ED Discharge Orders          Ordered    methocarbamol (ROBAXIN) 500 MG tablet  2 times daily        11/14/21 0710             Follow Up: Soundra Pilon, FNP [address] [place] [address] [phone]  Call  to schedule an appointment for close follow up  Alvester Morin Jannett Celestine, MD [address] [place] [address] [phone]  Call  to schedule an appointment for close follow up           This chart was dictated using voice recognition software.  Despite best efforts to proofread,  errors can occur which can change the documentation meaning.    Nira Conn, MD 11/14/21 [phone]

## 2022-01-11 NOTE — Progress Notes (Unsigned)
Cardiology Office Note:   Date:  01/14/2022  NAME:  Gregory Taylor    MRN: [id number] DOB:  July 14, 1950   PCP:  Soundra Pilon, FNP  Cardiologist:  None  Electrophysiologist:  None   Referring MD: Soundra Pilon, FNP   Chief Complaint  Patient presents with   Abnormal ECG    History of Present Illness:   Gregory Taylor is a 71 y.o. male with a hx of hypertension who is being seen today for the evaluation of first-degree AV block at the request of Soundra Pilon, FNP.  [place]  Apparently he had 3 days of shortness of breath and lightheadedness.  His blood pressure was also elevated.  This is all coincided with a herniated disc.  Apparently has been to the emergency room twice for pain in his back.  He has been on prednisone gabapentin as well as muscle relaxers.  He was seen in the urgent care for his shortness of breath and noted to have a first-degree AV block.  I did review the EKG from that visit.  He does indeed have a first-degree AV block.  He also has a first-degree AV block on his EKG today.  He actually reports his symptoms have resolved.  He reports no chest pain or trouble breathing.  His blood pressure has normalized.  He works as a Financial planner at [employer].  He reports no issues with playing tennis or hiking.  Again his symptoms have resolved.  He reports no chest pain or further shortness of breath.  He does have a family history of heart disease in his father who was a heavy smoker.  His total cholesterol is 138, HDL 55, LDL 72, triglycerides 44.  He has personally never had a heart attack or stroke.  He does take blood pressure medication.  He does not smoke.  He reports alcohol in moderation.  No drug use.  He is married with 2 children.  He has several grandchildren.  He overall is doing better.  His symptoms have resolved.  We discussed that his first-degree block does not require further testing.  CV exam is  normal.  Problem List HTN 1AVB  Past Medical History: Past Medical History:  Diagnosis Date   Hypertension    VARICOSE VEINS, LOWER EXTREMITIES 10/04/2007   Qualifier: Diagnosis of  By: Cato Mulligan MD, Bruce      Past Surgical History: Past Surgical History:  Procedure Laterality Date   KNEE ARTHROSCOPY Right    NASAL SEPTUM SURGERY     TONSILLECTOMY      Current Medications: Current Meds  Medication Sig   aspirin EC 81 MG tablet Take by mouth.   triamterene-hydrochlorothiazide (MAXZIDE-25) 37.5-25 MG per tablet TAKE ONE TABLET BY MOUTH DAILY     Allergies:    Patient has no known allergies.   Social History: Social History   Socioeconomic History   Marital status: Married    Spouse name: Not on file   Number of children: 2   Years of education: Not on file   Highest education level: Not on file  Occupational History    Comment: Production assistant, radio   Occupation: [employer] - Part Time  Tobacco Use   Smoking status: Never   Smokeless tobacco: Never  Vaping Use   Vaping Use: Never used  Substance and Sexual Activity   Alcohol use: Yes    Comment: occ  Drug use: No   Sexual activity: Yes  Other Topics Concern   Not on file  Social History Narrative   Not on file   Social Determinants of Health   Financial Resource Strain: Not on file  Food Insecurity: Not on file  Transportation Needs: Not on file  Physical Activity: Not on file  Stress: Not on file  Social Connections: Not on file     Family History: The patient's family history includes Cancer in his father; Healthy in his daughter and daughter; Heart attack in his father; Hypertension in an other family member; Stroke in his mother.  ROS:   All other ROS reviewed and negative. Pertinent positives noted in the HPI.     EKGs/Labs/Other Studies Reviewed:   The following studies were personally reviewed by me today:  EKG:  EKG is ordered today.  The ekg ordered today demonstrates normal sinus rhythm  heart rate 66, first-degree AV block, no acute ischemic changes, and was personally reviewed by me.   Recent Labs: 11/14/2021: ALT 24; BUN 20; Creatinine, Ser 1.12; Hemoglobin 15.6; Platelets 317; Potassium 3.6; Sodium 138   Recent Lipid Panel    Component Value Date/Time   CHOL 166 08/09/2013 1125   TRIG 239.0 (H) 08/09/2013 1125   HDL 45.30 08/09/2013 1125   CHOLHDL 4 08/09/2013 1125   VLDL 47.8 (H) 08/09/2013 1125   LDLCALC 73 08/09/2013 1125    Physical Exam:   VS:  BP 110/80   Pulse 66   Ht 5\' 8"  (1.727 m)   Wt 147 lb (66.7 kg)   SpO2 99%   BMI 22.35 kg/m    Wt Readings from Last 3 Encounters:  01/14/22 147 lb (66.7 kg)  05/21/21 151 lb 12.8 oz (68.9 kg)  11/20/20 149 lb 6.4 oz (67.8 kg)    General: Well nourished, well developed, in no acute distress Head: Atraumatic, normal size  Eyes: PEERLA, EOMI  Neck: Supple, no JVD Endocrine: No thryomegaly Cardiac: Normal S1, S2; RRR; no murmurs, rubs, or gallops Lungs: Clear to auscultation bilaterally, no wheezing, rhonchi or rales  Abd: Soft, nontender, no hepatomegaly  Ext: No edema, pulses 2+ Musculoskeletal: No deformities, BUE and BLE strength normal and equal Skin: Warm and dry, no rashes   Neuro: Alert and oriented to person, place, time, and situation, CNII-XII grossly intact, no focal deficits  Psych: Normal mood and affect   ASSESSMENT:   Gregory Taylor is a 71 y.o. male who presents for the following: 1. First degree AV block   2. Primary hypertension     PLAN:   1. First degree AV block -Does not explain his symptoms.  I suspect his episode was related to hypertension.  EKG does demonstrate a first-degree block but this is likely asymptomatic given his high level of activity.  This does not need further evaluation.  His cardiovascular lamination is normal.  He is quite healthy.  We discussed that he does not need to take aspirin.  No strong indication.  We also discussed calcium scoring for prevention  purposes.  His LDL cholesterol is likely at goal but given his family history he is interested in screening.  We will set him up for a calcium score.  He will see us back as needed based on the results of that test.  2. Primary hypertension -Welcome trolled.  No change in medications.  Disposition: Return if symptoms worsen or fail to improve.  Medication Adjustments/Labs and Tests Ordered: Current medicines are reviewed at  length with the patient today.  Concerns regarding medicines are outlined above.  Orders Placed This Encounter  Procedures   CT CARDIAC SCORING (SELF PAY ONLY)   EKG 12-Lead   No orders of the defined types were placed in this encounter.   Patient Instructions  Medication Instructions:  The current medical regimen is effective;  continue present plan and medications.  *If you need a refill on your cardiac medications before your next appointment, please call your pharmacy*   Testing/Procedures: Calcium Score    Follow-Up: At [facility], you and your health needs are our priority.  As part of our continuing mission to provide you with exceptional heart care, we have created designated Provider Care Teams.  These Care Teams include your primary Cardiologist (physician) and Advanced Practice Providers (APPs -  Physician Assistants and Nurse Practitioners) who all work together to provide you with the care you need, when you need it.  We recommend signing up for the patient portal called "MyChart".  Sign up information is provided on this After Visit Summary.  MyChart is used to connect with patients for Virtual Visits (Telemedicine).  Patients are able to view lab/test results, encounter notes, upcoming appointments, etc.  Non-urgent messages can be sent to your provider as well.   To learn more about what you can do with MyChart, go to [email or web address].    Your next appointment:   As needed  The format for your next appointment:   In  Person  Provider:   Eleonore Chiquito, MD           Signed, Addison Naegeli. Audie Box, MD, [place]  [address] [place], [place] [address] [phone]  01/14/2022 9:41 AM

## 2022-01-14 ENCOUNTER — Encounter: Payer: Self-pay | Admitting: Cardiovascular Disease

## 2022-01-14 ENCOUNTER — Ambulatory Visit: Payer: Medicare Other | Attending: Cardiovascular Disease | Admitting: Cardiovascular Disease

## 2022-01-14 VITALS — BP 110/80 | HR 66 | Ht 68.0 in | Wt 147.0 lb

## 2022-01-14 DIAGNOSIS — I440 Atrioventricular block, first degree: Secondary | ICD-10-CM | POA: Diagnosis not present

## 2022-01-14 DIAGNOSIS — I10 Essential (primary) hypertension: Secondary | ICD-10-CM

## 2022-01-14 NOTE — Patient Instructions (Signed)
Medication Instructions:  The current medical regimen is effective;  continue present plan and medications.  *If you need a refill on your cardiac medications before your next appointment, please call your pharmacy*   Testing/Procedures: Calcium Score    Follow-Up: At [facility], you and your health needs are our priority.  As part of our continuing mission to provide you with exceptional heart care, we have created designated Provider Care Teams.  These Care Teams include your primary Cardiologist (physician) and Advanced Practice Providers (APPs -  Physician Assistants and Nurse Practitioners) who all work together to provide you with the care you need, when you need it.  We recommend signing up for the patient portal called "MyChart".  Sign up information is provided on this After Visit Summary.  MyChart is used to connect with patients for Virtual Visits (Telemedicine).  Patients are able to view lab/test results, encounter notes, upcoming appointments, etc.  Non-urgent messages can be sent to your provider as well.   To learn more about what you can do with MyChart, go to [email or web address].    Your next appointment:   As needed  The format for your next appointment:   In Person  Provider:   Eleonore Chiquito, MD

## 2022-02-26 ENCOUNTER — Ambulatory Visit (HOSPITAL_BASED_OUTPATIENT_CLINIC_OR_DEPARTMENT_OTHER)
Admission: RE | Admit: 2022-02-26 | Discharge: 2022-02-26 | Disposition: A | Discharge status: Home / Self Care | Payer: Medicare Other | Source: Ambulatory Visit | Attending: Cardiovascular Disease | Admitting: Cardiovascular Disease

## 2022-02-26 DIAGNOSIS — I10 Essential (primary) hypertension: Secondary | ICD-10-CM | POA: Insufficient documentation

## 2022-02-26 DIAGNOSIS — I440 Atrioventricular block, first degree: Secondary | ICD-10-CM | POA: Insufficient documentation

## 2022-03-02 ENCOUNTER — Other Ambulatory Visit: Payer: Self-pay

## 2022-03-02 MED ORDER — ATORVASTATIN CALCIUM 20 MG PO TABS: 20.0000 mg | ORAL_TABLET | Freq: Every day | ORAL | 3 refills | Status: DC

## 2022-04-08 ENCOUNTER — Ambulatory Visit: Payer: Medicare Other | Attending: Nurse Practitioner | Admitting: Nurse Practitioner

## 2022-04-08 ENCOUNTER — Encounter: Payer: Self-pay | Admitting: Nurse Practitioner

## 2022-04-08 VITALS — BP 132/76 | HR 67 | Ht 69.0 in | Wt 154.4 lb

## 2022-04-08 DIAGNOSIS — R911 Solitary pulmonary nodule: Secondary | ICD-10-CM

## 2022-04-08 DIAGNOSIS — I440 Atrioventricular block, first degree: Secondary | ICD-10-CM | POA: Diagnosis not present

## 2022-04-08 DIAGNOSIS — I10 Essential (primary) hypertension: Secondary | ICD-10-CM

## 2022-04-08 DIAGNOSIS — R931 Abnormal findings on diagnostic imaging of heart and coronary circulation: Secondary | ICD-10-CM | POA: Diagnosis not present

## 2022-04-08 NOTE — Progress Notes (Signed)
Office Visit    Patient Name: Gregory Taylor Date of Encounter: 04/08/2022  Primary Care Provider:  Soundra Pilon, FNP Primary Cardiologist:  Reatha Harps, MD  Chief Complaint    71 year old male with a history of first-degree AV block, elevated coronary calcium score, hypertension, and varicose veins who presents for follow-up related to elevated CAC.  Past Medical History    Past Medical History:  Diagnosis Date   Hypertension    VARICOSE VEINS, LOWER EXTREMITIES 10/04/2007   Qualifier: Diagnosis of  By: Cato Mulligan MD, Bruce     Past Surgical History:  Procedure Laterality Date   KNEE ARTHROSCOPY Right    NASAL SEPTUM SURGERY     TONSILLECTOMY      Allergies  No Known Allergies  History of Present Illness    71 year old male with the above past medical history including first-degree AV block, elevated coronary calcium score, hypertension, and varicose veins.   He was previously evaluated by Dr. Jens Som (last seen by [facility] in 2015).  He was seen in urgent care in August 2023 following 3 days of shortness of breath and lightheadedness, elevated BP.  His symptoms coincided with a herniated disc.  He was not first-degree AV block EKG.  He was referred to cardiology for further evaluation.  He was last in the office on 01/14/2022 and was stable from a cardiac standpoint.  He denied symptoms concerning for angina.  He underwent coronary calcium scoring for further restratification which revealed calcium score of 607 (74th percentile).  He was started on Lipitor 20 mg daily.   He presents today for follow-up.  Since his last visit done well from a cardiac standpoint.  He states he never started taking his Lipitor due to the fact that he was concerned about side effects and wished to discuss indications for the medication with Dr. Flora Lipps. However, he is willing to start the medication.  He remains active and exercises most days of the week either playing tennis or doing yoga.  He  denies any symptoms concerning for angina.  BP has been well-controlled.  Overall, he reports feeling well.  Home Medications    Current Outpatient Medications  Medication Sig Dispense Refill   aspirin EC 81 MG tablet Take by mouth.     atorvastatin (LIPITOR) 20 MG tablet Take 1 tablet (20 mg total) by mouth daily. 90 tablet 3   triamterene-hydrochlorothiazide (DYAZIDE) 37.5-25 MG capsule Take 1 capsule by mouth daily.     triamterene-hydrochlorothiazide (MAXZIDE-25) 37.5-25 MG per tablet TAKE ONE TABLET BY MOUTH DAILY (Patient not taking: Reported on 04/08/2022) 90 tablet 1   No current facility-administered medications for this visit.     Review of Systems    He denies chest pain, palpitations, dyspnea, pnd, orthopnea, n, v, dizziness, syncope, edema, weight gain, or early satiety. All other systems reviewed and are otherwise negative except as noted above.   Physical Exam    VS:  BP 132/76 (BP Location: Left Arm, Patient Position: Sitting, Cuff Size: Normal)   Pulse 67   Ht 5\' 9"  (1.753 m)   Wt 154 lb 6.4 oz (70 kg)   SpO2 97%   BMI 22.80 kg/m  GEN: Well nourished, well developed, in no acute distress. HEENT: normal. Neck: Supple, no JVD, carotid bruits, or masses. Cardiac: RRR, no murmurs, rubs, or gallops. No clubbing, cyanosis, edema.  Radials/DP/PT 2+ and equal bilaterally.  Respiratory:  Respirations regular and unlabored, clear to auscultation bilaterally. GI: Soft, nontender, nondistended, BS +  x 4. MS: no deformity or atrophy. Skin: warm and dry, no rash. Neuro:  Strength and sensation are intact. Psych: Normal affect.  Accessory Clinical Findings    ECG personally reviewed by me today - No EKG in office today.   Lab Results  Component Value Date   WBC 14.0 (H) 11/14/2021   HGB 15.6 11/14/2021   HCT 45.8 11/14/2021   MCV 82.4 11/14/2021   PLT 317 11/14/2021   Lab Results  Component Value Date   CREATININE 1.12 11/14/2021   BUN 20 11/14/2021   NA 138  11/14/2021   K 3.6 11/14/2021   CL 101 11/14/2021   CO2 28 11/14/2021   Lab Results  Component Value Date   ALT 24 11/14/2021   AST 21 11/14/2021   ALKPHOS 57 11/14/2021   BILITOT 0.6 11/14/2021   Lab Results  Component Value Date   CHOL 166 08/09/2013   HDL 45.30 08/09/2013   LDLCALC 73 08/09/2013   TRIG 239.0 (H) 08/09/2013   CHOLHDL 4 08/09/2013    No results found for: "HGBA1C"  Assessment & Plan    1. Elevated coronary calcium score: Calcium score of 6 of 7 (74th percentile).  Stable with no anginal symptoms.  No indication for ischemic evaluation at this time.  He was prescribed Lipitor for secondary prevention, however, he never started taking the medication as he was concerned about side effects.  We discussed indications for statin therapy at this time.  Patient understands and is agreeable to start taking Lipitor 20 mg daily.  He will have a baseline fasting lipid panel updated with his PCP on 04/23/2022.  Will plan for repeat lipids, LFTs in 6 to 8 weeks following initiation of statin therapy.  Continue aspirin.  2. First-degree AV block: Asymptomatic.  He is not on beta-blocker therapy at this time.  3. Hypertension: BP well controlled. Continue current antihypertensive regimen.    4. Lung nodule: Stable on CT calcium scoring.  Was noted on prior CT imaging.  He was asking about this today.  I advised him to follow-up with his PCP.   5. Disposition: Follow-up in 4-6 months with Dr. Flora Lipps.      Joylene Grapes, NP 04/08/2022, 10:06 AM

## 2022-04-08 NOTE — Patient Instructions (Signed)
Medication Instructions:  Your physician recommends that you continue on your current medications as directed. Please refer to the Current Medication list given to you today.   *If you need a refill on your cardiac medications before your next appointment, please call your pharmacy*   Lab Work: Your physician recommends that you return for lab work in 6-8 weeks Fasting lipids & lfts  If you have labs (blood work) drawn today and your tests are completely normal, you will receive your results only by: MyChart Message (if you have MyChart) OR A paper copy in the mail If you have any lab test that is abnormal or we need to change your treatment, we will call you to review the results.   Testing/Procedures: NONE ordered at this time of appointment     Follow-Up: At [facility], you and your health needs are our priority.  As part of our continuing mission to provide you with exceptional heart care, we have created designated Provider Care Teams.  These Care Teams include your primary Cardiologist (physician) and Advanced Practice Providers (APPs -  Physician Assistants and Nurse Practitioners) who all work together to provide you with the care you need, when you need it.  We recommend signing up for the patient portal called "MyChart".  Sign up information is provided on this After Visit Summary.  MyChart is used to connect with patients for Virtual Visits (Telemedicine).  Patients are able to view lab/test results, encounter notes, upcoming appointments, etc.  Non-urgent messages can be sent to your provider as well.   To learn more about what you can do with MyChart, go to [email or web address].    Your next appointment:   4-6 month(s)  The format for your next appointment:   In Person  Provider:   Reatha Harps, MD     Other Instructions   Important Information About Sugar

## 2022-08-24 NOTE — Progress Notes (Unsigned)
Cardiology Office Note:   Date:  08/25/2022  NAME:  Gregory Taylor    MRN: [id number] DOB:  06/22/1950   PCP:  Soundra Pilon, FNP  Cardiologist:  Reatha Harps, MD  Electrophysiologist:  None   Referring MD: Soundra Pilon, FNP   Chief Complaint  Patient presents with   Follow-up        History of Present Illness:   Gregory Taylor is a 72 y.o. male with a hx of CAD and HTN who presents for follow-up.  Reports no symptoms of chest pain or trouble breathing.  Coronary calcium score in the 74th percentile.  We discussed the mainstay of therapy as prevention.  He reports he is still playing tennis 3-4 times per week.  He can play for 2 hours.  No limitations.  No chest discomfort.  No shortness of breath.  Blood pressure is well-controlled on current medications.  Currently taking aspirin and did start Lipitor 20 mg daily.  Needs repeat lipids.  Also would like to check LP(a).  Without any symptoms of angina.  CV examination normal.  T chol 138, HDL 55, LDL 72, TG 44  Problem List CAD -CAC 607 (74th percentile) 2. HLD -T chol 180, HDL 50, LDL 108, TG 121 3. 1AVB 4. HTN  Past Medical History: Past Medical History:  Diagnosis Date   Hypertension    VARICOSE VEINS, LOWER EXTREMITIES 10/04/2007   Qualifier: Diagnosis of  By: Cato Mulligan MD, Bruce      Past Surgical History: Past Surgical History:  Procedure Laterality Date   KNEE ARTHROSCOPY Right    NASAL SEPTUM SURGERY     TONSILLECTOMY      Current Medications: Current Meds  Medication Sig   aspirin EC 81 MG tablet Take by mouth.   atorvastatin (LIPITOR) 20 MG tablet Take 1 tablet (20 mg total) by mouth daily.   tamsulosin (FLOMAX) 0.4 MG CAPS capsule Take 0.4 mg by mouth daily.   triamterene-hydrochlorothiazide (DYAZIDE) 37.5-25 MG capsule Take 1 capsule by mouth daily.     Allergies:    Patient has no known allergies.   Social History: Social History   Socioeconomic History   Marital status: Married    Spouse  name: Not on file   Number of children: 2   Years of education: Not on file   Highest education level: Not on file  Occupational History    Comment: Production assistant, radio   Occupation: [employer] - Part Time  Tobacco Use   Smoking status: Never   Smokeless tobacco: Never  Vaping Use   Vaping Use: Never used  Substance and Sexual Activity   Alcohol use: Yes    Comment: occ   Drug use: No   Sexual activity: Yes  Other Topics Concern   Not on file  Social History Narrative   Not on file   Social Determinants of Health   Financial Resource Strain: Not on file  Food Insecurity: Not on file  Transportation Needs: Not on file  Physical Activity: Not on file  Stress: Not on file  Social Connections: Not on file     Family History: The patient's family history includes Cancer in his father; Healthy in his daughter and daughter; Heart attack in his father; Hypertension in an other family member; Stroke in his mother.  ROS:   All other ROS reviewed and negative. Pertinent positives noted in the HPI.     EKGs/Labs/Other Studies Reviewed:   The following studies were personally  reviewed by me today:  Recent Labs: 11/14/2021: ALT 24; BUN 20; Creatinine, Ser 1.12; Hemoglobin 15.6; Platelets 317; Potassium 3.6; Sodium 138   Recent Lipid Panel    Component Value Date/Time   CHOL 166 08/09/2013 1125   TRIG 239.0 (H) 08/09/2013 1125   HDL 45.30 08/09/2013 1125   CHOLHDL 4 08/09/2013 1125   VLDL 47.8 (H) 08/09/2013 1125   LDLCALC 73 08/09/2013 1125    Physical Exam:   VS:  BP 132/82   Pulse 62   Ht 5\' 9"  (1.753 m)   Wt 147 lb 12.8 oz (67 kg)   SpO2 97%   BMI 21.83 kg/m    Wt Readings from Last 3 Encounters:  08/25/22 147 lb 12.8 oz (67 kg)  04/08/22 154 lb 6.4 oz (70 kg)  01/14/22 147 lb (66.7 kg)    General: Well nourished, well developed, in no acute distress Head: Atraumatic, normal size  Eyes: PEERLA, EOMI  Neck: Supple, no JVD Endocrine: No thryomegaly Cardiac:  Normal S1, S2; RRR; no murmurs, rubs, or gallops Lungs: Clear to auscultation bilaterally, no wheezing, rhonchi or rales  Abd: Soft, nontender, no hepatomegaly  Ext: No edema, pulses 2+ Musculoskeletal: No deformities, BUE and BLE strength normal and equal Skin: Warm and dry, no rashes   Neuro: Alert and oriented to person, place, time, and situation, CNII-XII grossly intact, no focal deficits  Psych: Normal mood and affect   ASSESSMENT:   Gregory Taylor is a 72 y.o. male who presents for the following: 1. Coronary artery disease involving native coronary artery of native heart without angina pectoris   2. Mixed hyperlipidemia   3. Primary hypertension   4. Elevated PSA     PLAN:   1. Coronary artery disease involving native coronary artery of native heart without angina pectoris 2. Mixed hyperlipidemia -Coronary calcium score 607, 74 percentile.  Will continue aspirin therapy.  On Lipitor 20.  Recent lipids not at goal.  Has been on Lipitor since that value.  Repeat lipids.  We will also check liver function and LP(a).  He also requests PSA test.  No symptoms of angina.  CV exam normal.  No indications for stress testing at this time.  He does maintain a high level of activity.  Doing quite well.  He will see [place] yearly.  3. Primary hypertension -No change to medications.  4. Elevated PSA -He request PSA check today.  Disposition: Return in about 1 year (around 08/25/2023).  Medication Adjustments/Labs and Tests Ordered: Current medicines are reviewed at length with the patient today.  Concerns regarding medicines are outlined above.  Orders Placed This Encounter  Procedures   Lipid panel   Hepatic function panel   Lipoprotein A (LPA)   PSA   No orders of the defined types were placed in this encounter.   Patient Instructions  Medication Instructions:  The current medical regimen is effective;  continue present plan and medications.  *If you need a refill on your cardiac  medications before your next appointment, please call your pharmacy*   Lab Work: LIPID, LIVER, LPa, PSA- come back fasting- nothing to eat or drink   If you have labs (blood work) drawn today and your tests are completely normal, you will receive your results only by: MyChart Message (if you have MyChart) OR A paper copy in the mail If you have any lab test that is abnormal or we need to change your treatment, we will call you to review the results.  Follow-Up: At [facility], you and your health needs are our priority.  As part of our continuing mission to provide you with exceptional heart care, we have created designated Provider Care Teams.  These Care Teams include your primary Cardiologist (physician) and Advanced Practice Providers (APPs -  Physician Assistants and Nurse Practitioners) who all work together to provide you with the care you need, when you need it.  We recommend signing up for the patient portal called "MyChart".  Sign up information is provided on this After Visit Summary.  MyChart is used to connect with patients for Virtual Visits (Telemedicine).  Patients are able to view lab/test results, encounter notes, upcoming appointments, etc.  Non-urgent messages can be sent to your provider as well.   To learn more about what you can do with MyChart, go to [email or web address].    Your next appointment:   12 month(s)  Provider:   Reatha Harps, MD        Time Spent with Patient: I have spent a total of 35 minutes with patient reviewing hospital notes, telemetry, EKGs, labs and examining the patient as well as establishing an assessment and plan that was discussed with the patient.  > 50% of time was spent in direct patient care.  Signed, Lenna Gilford. Flora Lipps, MD, [facility]  [facility]  [address] [place], [place] [address] [phone]  08/25/2022 8:50 AM

## 2022-08-25 ENCOUNTER — Ambulatory Visit: Payer: Medicare Other | Attending: Cardiovascular Disease | Admitting: Cardiovascular Disease

## 2022-08-25 ENCOUNTER — Encounter: Payer: Self-pay | Admitting: Cardiovascular Disease

## 2022-08-25 VITALS — BP 132/82 | HR 62 | Ht 69.0 in | Wt 147.8 lb

## 2022-08-25 DIAGNOSIS — E782 Mixed hyperlipidemia: Secondary | ICD-10-CM | POA: Diagnosis not present

## 2022-08-25 DIAGNOSIS — R972 Elevated prostate specific antigen [PSA]: Secondary | ICD-10-CM

## 2022-08-25 DIAGNOSIS — I10 Essential (primary) hypertension: Secondary | ICD-10-CM

## 2022-08-25 DIAGNOSIS — I2510 Atherosclerotic heart disease of native coronary artery without angina pectoris: Secondary | ICD-10-CM

## 2022-08-25 NOTE — Patient Instructions (Signed)
Medication Instructions:  The current medical regimen is effective;  continue present plan and medications.  *If you need a refill on your cardiac medications before your next appointment, please call your pharmacy*   Lab Work: LIPID, LIVER, LPa, PSA- come back fasting- nothing to eat or drink   If you have labs (blood work) drawn today and your tests are completely normal, you will receive your results only by: MyChart Message (if you have MyChart) OR A paper copy in the mail If you have any lab test that is abnormal or we need to change your treatment, we will call you to review the results.   Follow-Up: At [facility], you and your health needs are our priority.  As part of our continuing mission to provide you with exceptional heart care, we have created designated Provider Care Teams.  These Care Teams include your primary Cardiologist (physician) and Advanced Practice Providers (APPs -  Physician Assistants and Nurse Practitioners) who all work together to provide you with the care you need, when you need it.  We recommend signing up for the patient portal called "MyChart".  Sign up information is provided on this After Visit Summary.  MyChart is used to connect with patients for Virtual Visits (Telemedicine).  Patients are able to view lab/test results, encounter notes, upcoming appointments, etc.  Non-urgent messages can be sent to your provider as well.   To learn more about what you can do with MyChart, go to [email or web address].    Your next appointment:   12 month(s)  Provider:   Reatha Harps, MD

## 2022-09-01 LAB — HEPATIC FUNCTION PANEL
ALT: 30 IU/L (ref 0–44)
AST: 34 IU/L (ref 0–40)
Albumin: 4.3 g/dL (ref 3.8–4.8)
Alkaline Phosphatase: 85 IU/L (ref 44–121)
Bilirubin Total: 0.4 mg/dL (ref 0.0–1.2)
Bilirubin, Direct: 0.12 mg/dL (ref 0.00–0.40)
Total Protein: 6.2 g/dL (ref 6.0–8.5)

## 2022-09-01 LAB — LIPID PANEL
Chol/HDL Ratio: 3 ratio (ref 0.0–5.0)
Cholesterol, Total: 142 mg/dL (ref 100–199)
HDL: 48 mg/dL (ref >39)
LDL Chol Calc (NIH): 80 mg/dL (ref 0–99)
Triglycerides: 69 mg/dL (ref 0–149)
VLDL Cholesterol Cal: 14 mg/dL (ref 5–40)

## 2022-09-01 LAB — LIPOPROTEIN A (LPA): Lipoprotein (a): 8.8 nmol/L (ref <75.0)

## 2022-09-01 LAB — PSA: Prostate Specific Ag, Serum: 2.5 ng/mL (ref 0.0–4.0)

## 2022-09-02 ENCOUNTER — Other Ambulatory Visit: Payer: Self-pay

## 2022-09-02 MED ORDER — ATORVASTATIN CALCIUM 40 MG PO TABS: 40.0000 mg | ORAL_TABLET | Freq: Every day | ORAL | 3 refills | Status: AC

## 2024-03-25 ENCOUNTER — Emergency Department (HOSPITAL_BASED_OUTPATIENT_CLINIC_OR_DEPARTMENT_OTHER)
Admission: EM | Admit: 2024-03-25 | Discharge: 2024-03-25 | Disposition: A | Discharge status: Home / Self Care | Attending: Emergency Medicine | Admitting: Emergency Medicine

## 2024-03-25 ENCOUNTER — Encounter (HOSPITAL_BASED_OUTPATIENT_CLINIC_OR_DEPARTMENT_OTHER): Payer: Self-pay | Admitting: Emergency Medicine

## 2024-03-25 ENCOUNTER — Emergency Department (HOSPITAL_BASED_OUTPATIENT_CLINIC_OR_DEPARTMENT_OTHER): Admitting: Radiology

## 2024-03-25 DIAGNOSIS — Z79899 Other long term (current) drug therapy: Secondary | ICD-10-CM | POA: Insufficient documentation

## 2024-03-25 DIAGNOSIS — I10 Essential (primary) hypertension: Secondary | ICD-10-CM | POA: Insufficient documentation

## 2024-03-25 DIAGNOSIS — R0602 Shortness of breath: Secondary | ICD-10-CM | POA: Diagnosis not present

## 2024-03-25 DIAGNOSIS — R42 Dizziness and giddiness: Secondary | ICD-10-CM | POA: Diagnosis present

## 2024-03-25 DIAGNOSIS — R079 Chest pain, unspecified: Secondary | ICD-10-CM

## 2024-03-25 DIAGNOSIS — Z7982 Long term (current) use of aspirin: Secondary | ICD-10-CM | POA: Diagnosis not present

## 2024-03-25 DIAGNOSIS — R0789 Other chest pain: Secondary | ICD-10-CM | POA: Diagnosis not present

## 2024-03-25 LAB — CBC WITH DIFFERENTIAL/PLATELET
Abs Immature Granulocytes: 0.01 K/uL (ref 0.00–0.07)
Basophils Absolute: 0.1 K/uL (ref 0.0–0.1)
Basophils Relative: 1 %
Eosinophils Absolute: 0.5 K/uL (ref 0.0–0.5)
Eosinophils Relative: 7 %
HCT: 46 % (ref 39.0–52.0)
Hemoglobin: 15.6 g/dL (ref 13.0–17.0)
Immature Granulocytes: 0 %
Lymphocytes Relative: 25 %
Lymphs Abs: 2 K/uL (ref 0.7–4.0)
MCH: 28.5 pg (ref 26.0–34.0)
MCHC: 33.9 g/dL (ref 30.0–36.0)
MCV: 83.9 fL (ref 80.0–100.0)
Monocytes Absolute: 0.8 K/uL (ref 0.1–1.0)
Monocytes Relative: 10 %
Neutro Abs: 4.8 K/uL (ref 1.7–7.7)
Neutrophils Relative %: 57 %
Platelets: 266 K/uL (ref 150–400)
RBC: 5.48 MIL/uL (ref 4.22–5.81)
RDW: 13.9 % (ref 11.5–15.5)
WBC: 8.3 K/uL (ref 4.0–10.5)
nRBC: 0 % (ref 0.0–0.2)

## 2024-03-25 LAB — BASIC METABOLIC PANEL WITH GFR
Anion gap: 10 (ref 5–15)
BUN: 13 mg/dL (ref 8–23)
CO2: 28 mmol/L (ref 22–32)
Calcium: 10 mg/dL (ref 8.9–10.3)
Chloride: 100 mmol/L (ref 98–111)
Creatinine, Ser: 1.06 mg/dL (ref 0.61–1.24)
GFR, Estimated: >60 mL/min (ref >60)
Glucose, Bld: 122 mg/dL — ABNORMAL HIGH (ref 70–99)
Potassium: 4.1 mmol/L (ref 3.5–5.1)
Sodium: 138 mmol/L (ref 135–145)

## 2024-03-25 LAB — TROPONIN T, HIGH SENSITIVITY: Troponin T High Sensitivity: 16 ng/L (ref 0–19)

## 2024-03-25 NOTE — ED Provider Notes (Signed)
 [place] [facility] AT [facility] Provider Note   CSN: [id number] Arrival date & time: 03/25/24  9096     Patient presents with: Chest Pain   Gregory Taylor is a 73 y.o. male.   Patient is a 73 year old male with past medical history of hypertension presenting to the emergency department with chest pain and lightheadedness.  Patient reports that he was watching TV last night when he suddenly started to develop some chest discomfort.  He states he had associated shortness of breath and lightheadedness.  He states it lasted about 30 minutes and went away on its own.  He states after getting up this morning around 8:30 AM he started to feel the lightheadedness returned.  He states that the chest pain did not return at this time.  The patient denies any associated fever, cough or lower extremity swelling.  He reports that he had a similar episode about 3 years ago and was told that he had an AV issue and cardiology told him if this happen again that he needed to be reevaluated which prompted him to come to the emergency department.  The history is provided by the patient.  Chest Pain      Prior to Admission medications   Medication Sig Start Date End Date Taking? Authorizing Provider  aspirin EC 81 MG tablet Take by mouth.    [provider]  atorvastatin  (LIPITOR) 40 MG tablet Take 1 tablet (40 mg total) by mouth daily. 09/02/22 08/28/23  O'NealDarryle Ned, MD  tamsulosin (FLOMAX) 0.4 MG CAPS capsule Take 0.4 mg by mouth daily. 06/25/22   [provider]  triamterene -hydrochlorothiazide (DYAZIDE) 37.5-25 MG capsule Take 1 capsule by mouth daily. 01/14/22   [provider]    Allergies: Patient has no known allergies.    Review of Systems  Cardiovascular:  Positive for chest pain.    Updated Vital Signs BP (!) 142/83   Pulse 62   Temp 97.6 F (36.4 C) (Oral)   Resp 14   Wt 63.5 kg   SpO2 99%   BMI 20.67 kg/m   Physical  Exam Vitals and nursing note reviewed.  Constitutional:      General: He is not in acute distress.    Appearance: He is well-developed.  HENT:     Head: Normocephalic.  Eyes:     Extraocular Movements: Extraocular movements intact.  Cardiovascular:     Rate and Rhythm: Normal rate and regular rhythm.     Pulses:          Radial pulses are 2+ on the right side and 2+ on the left side.     Heart sounds: Normal heart sounds.  Pulmonary:     Effort: Pulmonary effort is normal.     Breath sounds: Normal breath sounds.  Chest:     Chest wall: No tenderness.  Abdominal:     Palpations: Abdomen is soft.     Tenderness: There is no abdominal tenderness.  Musculoskeletal:        General: Normal range of motion.     Cervical back: Normal range of motion and neck supple.     Right lower leg: No edema.     Left lower leg: No edema.  Skin:    General: Skin is warm and dry.  Neurological:     General: No focal deficit present.     Mental Status: He is alert and oriented to person, place, and time.  Psychiatric:  Mood and Affect: Mood normal.        Behavior: Behavior normal.     (all labs ordered are listed, but only abnormal results are displayed) Labs Reviewed  BASIC METABOLIC PANEL WITH GFR - Abnormal; Notable for the following components:      Result Value   Glucose, Bld 122 (*)    All other components within normal limits  CBC WITH DIFFERENTIAL/PLATELET  TROPONIN T, HIGH SENSITIVITY    EKG: EKG Interpretation Date/Time:  Sunday March 25 2024 09:17:21 EST Ventricular Rate:  67 PR Interval:  242 QRS Duration:  92 QT Interval:  412 QTC Calculation: 435 R Axis:   33  Text Interpretation: Sinus rhythm Prolonged PR interval No previous ECGs available Confirmed by Ellouise Fine ([id number]) on 03/25/2024 9:32:37 AM  Radiology: [facility] Chest 2 View Result Date: 03/25/2024 CLINICAL DATA:  Chest pain for 2 days. EXAM: CHEST - 2 VIEW COMPARISON:  None Available. FINDINGS:  The heart size and mediastinal contours are within normal limits. Both lungs are clear. Incidental note is made of nipple shadows overlying the lower lungs on the frontal projection. Lower thoracic spine degenerative disc disease noted. IMPRESSION: No active cardiopulmonary disease. Electronically Signed   By: Norleen DELENA Kil M.D.   On: 03/25/2024 10:45     Procedures   Medications Ordered in the ED - No data to display  Clinical Course as of 03/25/24 1121  Sun Mar 25, 2024  1021 Labs within normal range. Chest pain last night so single troponin is sufficient.  [VK]  1053 No acute disease on chest xray. Patient is asymptomatic now and is stable for discharge home. Recommended outpatient cardiology follow up. [VK]    Clinical Course User Index [VK] [name], [name], DO                                 Medical Decision Making This patient presents to the ED with chief complaint(s) of chest pain, dizziness with pertinent past medical history of hypertension which further complicates the presenting complaint. The complaint involves an extensive differential diagnosis and also carries with it a high risk of complications and morbidity.    The differential diagnosis includes ACS, arrhythmia, anemia pneumonia, pneumothorax, pulmonary edema, pleural effusion, gastritis, GERD  Additional history obtained: Additional history obtained from spouse Records reviewed outpatient cardiology records  ED Course and Reassessment: On patient's arrival he is hemodynamically stable in no acute distress.  EKG had no acute ischemic changes.  Patient will have labs including troponin performed as well as chest x-ray to evaluate for etiology of his symptoms.  After reviewing cardiology records, it appeared that he was seen for a first-degree AV block but did not think to be related to his symptoms.  He is currently asymptomatic at this time.  Independent labs interpretation:  The following labs were  independently interpreted: within normal range  Independent visualization of imaging: - I independently visualized the following imaging with scope of interpretation limited to determining acute life threatening conditions related to emergency care: CXR, which revealed no acute disease  Consultation: - Consulted or discussed management/test interpretation w/ external professional: N/A  Consideration for admission or further workup: Patient has no emergent conditions requiring admission or further work-up at this time and is stable for discharge home with primary care and cardiology follow-up  Social Determinants of health: N/A    Amount and/or Complexity of Data Reviewed Labs: ordered. Radiology: ordered.  Final diagnoses:  Nonspecific chest pain  Dizziness    ED Discharge Orders     None          Ellouise Richerd POUR, DO 03/25/24 1121

## 2024-03-25 NOTE — ED Notes (Signed)
 ED Provider at bedside.

## 2024-03-25 NOTE — ED Triage Notes (Signed)
 Pt endorses intermittent CP, shob, feeling light-headed with fogginess and htn x 3 days

## 2024-03-25 NOTE — ED Notes (Signed)
 DC paperwork given and verbally understood.

## 2024-03-25 NOTE — Discharge Instructions (Signed)
 You were seen in the emergency department for your dizziness and your chest pain.  Your workup today showed no signs of heart attack or other abnormalities with your blood work.  It is unclear what is causing your symptoms at this time but I would follow-up with your primary doctor and cardiologist for further evaluation.  You should return to the emergency department if your chest pain gets significantly worse, you have severe shortness of breath, you pass out or if you have any other new or concerning symptoms.

## 2024-03-29 NOTE — Progress Notes (Unsigned)
 Cardiology Office Note:  .   Date:  03/30/2024  ID:  Alm VEAR Hammersmith, DOB [birth year], MRN [id number] PCP: Marvene Prentice SAUNDERS, FNP  [facility] Providers Cardiologist:  Darryle ONEIDA Decent, MD   History of Present Illness: .    Chief Complaint  Patient presents with   Follow-up    THESEUS BIRNIE is a 73 y.o. male with history of CAD who presents for follow-up.    History of Present Illness   BERYLE ZEITZ is a 73 year old male with elevated coronary calcium  score, hyperlipidemia, and hypertension who presents for follow-up after a recent episode of chest pain.  He experienced an unusual chest pain last Friday, described as similar to intercostal muscle pain rather than a deep pain. The pain was more acute than previous episodes, prompting him to lay on the floor, which alleviated the discomfort. The following day, he experienced lightheadedness and unusual fatigue during normal activities. On Sunday, he experienced the chest pain again, checked his blood pressure, and found it elevated at 170/100 mmHg. He laid down and rechecked his blood pressure twice more, noting persistent high systolic readings, which led him to seek evaluation at a nearby facility. Upon arrival, his blood pressure was 177/100 mmHg, but it moderated over time. Cardiac enzymes were checked and returned normal.  No further chest pain or breathing difficulties have been noted in the past three days. He is able to engage in normal activities, including playing indoor tennis for two hours on Tuesday and Wednesday without discomfort. He attributes the high blood pressure episode to dietary factors, specifically consuming beer and pretzels the night before the episode.  He has a history of elevated coronary calcium  score of 607, placing him in the 74th percentile, hyperlipidemia, and hypertension. He is currently on Lipitor 40 mg, with his most recent LDL recorded at 90 mg/dL. He continues to take aspirin 81 mg daily. He mentions a  glucose level of 125 mg/dL during his recent hospital visit, which he attributes to consuming six powdered doughnuts and coffee with cream.  No chest pain, trouble breathing, or leg swelling. He reports pain in his left leg but attributes it to mountain biking. He engages in regular physical activity, including playing tennis.          Problem List CAD -CAC 607 (74th percentile) 2. HLD -T chol 160, HDL 48, LDL 90, TG 123 3. 1AVB 4. HTN    ROS: All other ROS reviewed and negative. Pertinent positives noted in the HPI.     Studies Reviewed: SABRA       EKG 03/25/2024 NSR 67 bpm, 1AVB Physical Exam:   VS:  BP 130/88   Pulse (!) 59   Ht 5' 9 (1.753 m)   Wt 146 lb (66.2 kg)   SpO2 99%   BMI 21.56 kg/m    Wt Readings from Last 3 Encounters:  03/30/24 146 lb (66.2 kg)  03/25/24 140 lb (63.5 kg)  08/25/22 147 lb 12.8 oz (67 kg)    GEN: Well nourished, well developed in no acute distress NECK: No JVD; No carotid bruits CARDIAC: RRR, no murmurs, rubs, gallops RESPIRATORY:  Clear to auscultation without rales, wheezing or rhonchi  ABDOMEN: Soft, non-tender, non-distended EXTREMITIES:  No edema; No deformity  ASSESSMENT AND PLAN: .   Assessment and Plan    Precordial pain Intermittent chest pain with recent episode of elevated blood pressure and lightheadedness. Cardiac enzymes normal. Differential includes possible coronary artery blockage. - Ordered  coronary CTA with contrast. - Ordered echocardiogram to evaluate heart function.  Atherosclerotic heart disease of native coronary artery without angina pectoris Elevated coronary calcium  score of 607, 74th percentile. Recent chest pain episode warrants further investigation. - On ASA. Testing as above.   Mixed hyperlipidemia LDL cholesterol at 90 mg/dL on Lipitor 40 mg, not at goal. Recent glucose level elevated at 125 mg/dL, possibly due to dietary intake. Decision to wait for January labs before considering medication  adjustment. - Continue current Lipitor 40 mg daily. - Recheck cholesterol levels in January with primary care provider. - Consider adding Zetia 10 mg daily if cholesterol remains elevated.  Essential (primary) hypertension Recent episode of elevated blood pressure at 177/100 mmHg, possibly related to dietary intake. Blood pressure has since normalized. - Monitor blood pressure daily. - Continue current antihypertensive regimen.  First degree atrioventricular block No current symptoms or changes in heart rate. Heart rate is good and low, no need for beta blocker. - Continue current management.              Follow-up: Return in about 1 year (around 03/30/2025).  Signed, Darryle DASEN. Barbaraann, MD, [facility]  [facility]  [address] [place], [place] [address] [phone]  1:20 PM

## 2024-03-30 ENCOUNTER — Ambulatory Visit: Attending: Cardiovascular Disease | Admitting: Cardiovascular Disease

## 2024-03-30 ENCOUNTER — Encounter: Payer: Self-pay | Admitting: Cardiovascular Disease

## 2024-03-30 VITALS — BP 130/88 | HR 59 | Ht 69.0 in | Wt 146.0 lb

## 2024-03-30 DIAGNOSIS — I2510 Atherosclerotic heart disease of native coronary artery without angina pectoris: Secondary | ICD-10-CM | POA: Diagnosis not present

## 2024-03-30 DIAGNOSIS — E782 Mixed hyperlipidemia: Secondary | ICD-10-CM | POA: Diagnosis not present

## 2024-03-30 DIAGNOSIS — I10 Essential (primary) hypertension: Secondary | ICD-10-CM

## 2024-03-30 DIAGNOSIS — I440 Atrioventricular block, first degree: Secondary | ICD-10-CM

## 2024-03-30 DIAGNOSIS — R072 Precordial pain: Secondary | ICD-10-CM | POA: Diagnosis not present

## 2024-03-30 NOTE — Patient Instructions (Addendum)
 Medication Instructions:  Continue all medications *If you need a refill on your cardiac medications before your next appointment, please call your pharmacy*  Lab Work: None ordered  Testing/Procedures: Coronary CT will be scheduled after approved by insurance    Follow instructions below   Echo first available   Follow-Up: At [facility], you and your health needs are our priority.  As part of our continuing mission to provide you with exceptional heart care, our providers are all part of one team.  This team includes your primary Cardiologist (physician) and Advanced Practice Providers or APPs (Physician Assistants and Nurse Practitioners) who all work together to provide you with the care you need, when you need it.  Your next appointment:  1 year     Call in August to schedule Dec appointment        Provider:  Dr.O'Neal         Your cardiac CT will be scheduled at one of the below locations:   [facility] [address] [place], [place] [address] [phone] (Severe contrast allergies only)  OR   [facility] [address] [place], [place] [address] [phone]  OR   [facility] [address] [place], [place] [address] [phone]  OR   [facility] [address]  [place], [place] [address]  OR   [facility] [address] [place], [place] [phone]  If scheduled at [facility], please arrive at the [facility] and Children's Entrance (Entrance C2) of [facility] 30 minutes prior to test start time. You can use the FREE valet parking offered at entrance C (encouraged to control the heart rate for the test)  Proceed to the [facility] Radiology Department (first floor) to check-in and test prep.  All radiology patients and guests should use entrance C2 at [facility], accessed from [facility], even though the hospital's  physical address listed is [address].  If scheduled at the Heart and Vascular Tower at [address], please enter the parking lot using the [address] entrance and use the FREE valet service at the patient drop-off area. Enter the building and check-in with registration on the main floor.  If scheduled at [facility], please arrive to the Heart and Vascular Center 15 mins early for check-in and test prep.  There is spacious parking and easy access to the radiology department from the [facility] Heart and Vascular entrance. Please enter here and check-in with the desk attendant.   If scheduled at [facility], please arrive 30 minutes early for check-in and test prep.  Please follow these instructions carefully (unless otherwise directed):  An IV will be required for this test and Nitroglycerin will be given.  Hold all erectile dysfunction medications at least 3 days (72 hrs) prior to test. (Ie viagra, cialis, sildenafil, tadalafil, etc)   On the Night Before the Test: Be sure to Drink plenty of water. Do not consume any caffeinated/decaffeinated beverages or chocolate 12 hours prior to your test. Do not take any antihistamines 12 hours prior to your test.    On the Day of the Test: Drink plenty of water until 1 hour prior to the test. Do not eat any food 1 hour prior to test. You may take your regular medications prior to the test.  If you take Furosemide/Hydrochlorothiazide/Spironolactone/Chlorthalidone, please HOLD on the morning of the test.  After the Test: Drink plenty of water. After receiving IV contrast, you may experience a mild flushed feeling. This is normal. On occasion, you may experience a mild rash up to 24 hours after the test. This is not dangerous. If this occurs, you can take Benadryl 25 mg, Zyrtec, Claritin, or Allegra and increase your fluid intake. (Patients taking Tikosyn should avoid Benadryl, and may take  Zyrtec, Claritin, or Allegra) If you experience trouble breathing, this can be serious. If it is severe call 911 IMMEDIATELY. If it is mild, please call our office.  We will call to schedule your test 2-4 weeks out understanding that some insurance companies will need an authorization prior to the service being performed.   For more information and frequently asked questions, please visit our website : [email or web address]  For non-scheduling related questions, please contact the cardiac imaging nurse navigator should you have any questions/concerns: Cardiac Imaging Nurse Navigators Direct Office Dial: [phone]   For scheduling needs, including cancellations and rescheduling, please call Brittany, [phone].    We recommend signing up for the patient portal called MyChart.  Sign up information is provided on this After Visit Summary.  MyChart is used to connect with patients for Virtual Visits (Telemedicine).  Patients are able to view lab/test results, encounter notes, upcoming appointments, etc.  Non-urgent messages can be sent to your provider as well.   To learn more about what you can do with MyChart, go to [email or web address].

## 2024-04-02 ENCOUNTER — Ambulatory Visit (HOSPITAL_COMMUNITY): Admission: RE | Admit: 2024-04-02 | Discharge: 2024-04-02 | Attending: Cardiovascular Disease

## 2024-04-02 ENCOUNTER — Ambulatory Visit: Payer: Self-pay | Admitting: Cardiovascular Disease

## 2024-04-02 DIAGNOSIS — I10 Essential (primary) hypertension: Secondary | ICD-10-CM

## 2024-04-02 DIAGNOSIS — I440 Atrioventricular block, first degree: Secondary | ICD-10-CM

## 2024-04-02 DIAGNOSIS — I2510 Atherosclerotic heart disease of native coronary artery without angina pectoris: Secondary | ICD-10-CM

## 2024-04-02 DIAGNOSIS — R072 Precordial pain: Secondary | ICD-10-CM

## 2024-04-02 DIAGNOSIS — E782 Mixed hyperlipidemia: Secondary | ICD-10-CM

## 2024-04-02 LAB — ECHOCARDIOGRAM COMPLETE
Area-P 1/2: 1.84 cm2
S' Lateral: 2.75 cm

## 2024-04-16 ENCOUNTER — Encounter (HOSPITAL_COMMUNITY): Payer: Self-pay

## 2024-04-20 ENCOUNTER — Ambulatory Visit (HOSPITAL_COMMUNITY)
Admission: RE | Admit: 2024-04-20 | Discharge: 2024-04-20 | Disposition: A | Discharge status: Home / Self Care | Source: Ambulatory Visit | Attending: Cardiology | Admitting: Cardiology

## 2024-04-20 DIAGNOSIS — E782 Mixed hyperlipidemia: Secondary | ICD-10-CM | POA: Insufficient documentation

## 2024-04-20 DIAGNOSIS — R072 Precordial pain: Secondary | ICD-10-CM | POA: Insufficient documentation

## 2024-04-20 DIAGNOSIS — I2510 Atherosclerotic heart disease of native coronary artery without angina pectoris: Secondary | ICD-10-CM | POA: Diagnosis present

## 2024-04-20 DIAGNOSIS — I10 Essential (primary) hypertension: Secondary | ICD-10-CM | POA: Diagnosis present

## 2024-04-20 DIAGNOSIS — I440 Atrioventricular block, first degree: Secondary | ICD-10-CM | POA: Insufficient documentation

## 2024-04-20 MED ORDER — NITROGLYCERIN 0.4 MG SL SUBL
0.8000 mg | SUBLINGUAL_TABLET | Freq: Once | SUBLINGUAL | Status: AC
  Administered 2024-04-20: 0.8 mg via SUBLINGUAL

## 2024-04-20 MED ORDER — IOHEXOL 350 MG/ML SOLN
100.0000 mL | Freq: Once | INTRAVENOUS | Status: AC | PRN
  Administered 2024-04-20: 100 mL via INTRAVENOUS
